# Patient Record
Sex: Female | Born: 1960 | Race: White | Hispanic: No | Smoking: Current every day smoker
Health system: Southern US, Community
[De-identification: ages and names within clinical notes are randomized; demographics above are authoritative.]

## PROBLEM LIST (undated history)

## (undated) DIAGNOSIS — G8929 Other chronic pain: Secondary | ICD-10-CM

## (undated) DIAGNOSIS — M5136 Other intervertebral disc degeneration, lumbar region: Secondary | ICD-10-CM

## (undated) DIAGNOSIS — M51369 Other intervertebral disc degeneration, lumbar region without mention of lumbar back pain or lower extremity pain: Secondary | ICD-10-CM

## (undated) DIAGNOSIS — E119 Type 2 diabetes mellitus without complications: Secondary | ICD-10-CM

## (undated) DIAGNOSIS — M1990 Unspecified osteoarthritis, unspecified site: Secondary | ICD-10-CM

## (undated) DIAGNOSIS — F419 Anxiety disorder, unspecified: Secondary | ICD-10-CM

## (undated) DIAGNOSIS — I10 Essential (primary) hypertension: Secondary | ICD-10-CM

## (undated) DIAGNOSIS — G629 Polyneuropathy, unspecified: Secondary | ICD-10-CM

## (undated) DIAGNOSIS — M5126 Other intervertebral disc displacement, lumbar region: Secondary | ICD-10-CM

## (undated) DIAGNOSIS — Z95828 Presence of other vascular implants and grafts: Secondary | ICD-10-CM

## (undated) HISTORY — DX: Anxiety disorder, unspecified: F41.9

## (undated) HISTORY — DX: Essential (primary) hypertension: I10

---
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
## 2001-06-10 ENCOUNTER — Emergency Department (HOSPITAL_COMMUNITY): Admission: EM | Admit: 2001-06-10 | Discharge: 2001-06-11 | Payer: Self-pay | Admitting: *Deleted

## 2007-12-29 ENCOUNTER — Ambulatory Visit (HOSPITAL_COMMUNITY): Admission: RE | Admit: 2007-12-29 | Discharge: 2007-12-29 | Payer: Self-pay | Admitting: Pulmonary Disease

## 2011-06-15 ENCOUNTER — Emergency Department: Payer: Self-pay | Admitting: Emergency Medicine

## 2013-02-07 ENCOUNTER — Other Ambulatory Visit (HOSPITAL_COMMUNITY): Payer: Self-pay | Admitting: Nurse Practitioner

## 2013-02-07 DIAGNOSIS — Z139 Encounter for screening, unspecified: Secondary | ICD-10-CM

## 2013-02-14 ENCOUNTER — Ambulatory Visit (HOSPITAL_COMMUNITY)
Admission: RE | Admit: 2013-02-14 | Discharge: 2013-02-14 | Disposition: A | Discharge status: Home / Self Care | Payer: Self-pay | Source: Ambulatory Visit | Attending: Nurse Practitioner | Admitting: Nurse Practitioner

## 2013-02-14 DIAGNOSIS — Z139 Encounter for screening, unspecified: Secondary | ICD-10-CM

## 2013-11-24 ENCOUNTER — Emergency Department: Payer: Self-pay | Admitting: Internal Medicine

## 2015-07-25 ENCOUNTER — Ambulatory Visit: Payer: Self-pay

## 2015-08-01 ENCOUNTER — Other Ambulatory Visit: Payer: Self-pay | Admitting: Oncology

## 2015-08-01 ENCOUNTER — Ambulatory Visit: Payer: Self-pay | Attending: Oncology

## 2015-08-01 ENCOUNTER — Ambulatory Visit
Admission: RE | Admit: 2015-08-01 | Discharge: 2015-08-01 | Disposition: A | Discharge status: Home / Self Care | Payer: Self-pay | Source: Ambulatory Visit | Attending: Oncology | Admitting: Oncology

## 2015-08-01 VITALS — BP 117/80 | HR 102 | Temp 97.7°F | Ht 63.0 in | Wt 174.6 lb

## 2015-08-01 DIAGNOSIS — Z0000 Encounter for general adult medical examination without abnormal findings: Secondary | ICD-10-CM

## 2015-08-01 NOTE — Progress Notes (Signed)
Subjective:     Patient ID: Tammy Rubio, female   DOB: 1961/06/10, 54 y.o.   MRN: [id number]  HPI   Review of Systems     Objective:   Physical Exam  Pulmonary/Chest: Right breast exhibits no inverted nipple, no mass, no nipple discharge, no skin change and no tenderness. Left breast exhibits no inverted nipple, no mass, no nipple discharge, no skin change and no tenderness. Breasts are asymmetrical.  Left breast 1/2 cup size smaller than right       Assessment:    54 year old patient presents for [facility].  Patient screened, and meets BCCCP eligibility.  Patient does not have insurance, Medicare or Medicaid.  Handout given on Affordable Care Act. CBE unremarkable.  Instructed patient on breast self-exam using teach back method.  Patient uses [employer] for transportation.     Plan:     Sent for bilateral screening mammogram.

## 2015-08-04 LAB — PAP LB AND HPV HIGH-RISK
HPV, HIGH-RISK: NEGATIVE
PAP SMEAR COMMENT: 0

## 2015-08-21 NOTE — Progress Notes (Signed)
Phoned patient to give her normal mammogram and pap results. Reminded her to return in one year for annual mammogram.  Her next pap is due in 5 years.

## 2016-08-06 ENCOUNTER — Ambulatory Visit: Payer: Self-pay | Attending: Oncology

## 2016-08-06 ENCOUNTER — Ambulatory Visit
Admission: RE | Admit: 2016-08-06 | Discharge: 2016-08-06 | Disposition: A | Discharge status: Home / Self Care | Payer: Self-pay | Source: Ambulatory Visit | Attending: Oncology | Admitting: Oncology

## 2016-08-06 ENCOUNTER — Encounter (INDEPENDENT_AMBULATORY_CARE_PROVIDER_SITE_OTHER): Payer: Self-pay

## 2016-08-06 VITALS — BP 131/83 | HR 108 | Temp 98.2°F | Ht 63.39 in | Wt 165.9 lb

## 2016-08-06 DIAGNOSIS — Z0000 Encounter for general adult medical examination without abnormal findings: Secondary | ICD-10-CM

## 2016-08-06 NOTE — Progress Notes (Signed)
Subjective:     Patient ID: Tammy Rubio, female   DOB: October 27, 1960, 55 y.o.   MRN: [id number]  HPI   Review of Systems     Objective:   Physical Exam  Pulmonary/Chest: Right breast exhibits no inverted nipple, no mass, no nipple discharge, no skin change and no tenderness. Left breast exhibits no inverted nipple, no mass, no nipple discharge, no skin change and no tenderness. Breasts are symmetrical.  Tattoo left chest       Assessment:     55 year old patient presents for [facility] clinic visit.  Patient screened, and meets BCCCP eligibility.  Patient does not have insurance, Medicare or Medicaid.  Handout given on Affordable Care Act.  Instructed patient on breast self-exam using teach back method.  CBE unremarkable.  No mass or lump palpated. Patient had normal pap in 2015 with negative HPV.  Refuses pelvic exam.    Plan:     Sent for bilateral screening mammogram.

## 2016-08-13 NOTE — Progress Notes (Signed)
Letter mailed from [facility] to notify of normal mammogram results.  Patient to return in one year for annual screening.  Copy to [facility]. 

## 2019-05-31 ENCOUNTER — Other Ambulatory Visit (HOSPITAL_COMMUNITY): Payer: Self-pay | Admitting: Family Medicine

## 2019-05-31 DIAGNOSIS — Z1231 Encounter for screening mammogram for malignant neoplasm of breast: Secondary | ICD-10-CM

## 2019-06-06 ENCOUNTER — Ambulatory Visit (HOSPITAL_COMMUNITY)
Admission: RE | Admit: 2019-06-06 | Discharge: 2019-06-06 | Disposition: A | Discharge status: Home / Self Care | Payer: Self-pay | Source: Ambulatory Visit | Attending: Family Medicine | Admitting: Family Medicine

## 2019-06-06 ENCOUNTER — Other Ambulatory Visit: Payer: Self-pay

## 2019-06-06 DIAGNOSIS — Z1231 Encounter for screening mammogram for malignant neoplasm of breast: Secondary | ICD-10-CM | POA: Insufficient documentation

## 2019-06-14 ENCOUNTER — Other Ambulatory Visit (HOSPITAL_COMMUNITY): Payer: Self-pay | Admitting: Family Medicine

## 2019-06-14 DIAGNOSIS — R928 Other abnormal and inconclusive findings on diagnostic imaging of breast: Secondary | ICD-10-CM

## 2019-06-17 ENCOUNTER — Ambulatory Visit (HOSPITAL_COMMUNITY): Payer: Medicaid Other

## 2019-06-17 ENCOUNTER — Ambulatory Visit (HOSPITAL_COMMUNITY)
Admission: RE | Admit: 2019-06-17 | Discharge: 2019-06-17 | Disposition: A | Discharge status: Home / Self Care | Payer: Medicaid Other | Source: Ambulatory Visit | Attending: Family Medicine | Admitting: Family Medicine

## 2019-06-17 ENCOUNTER — Other Ambulatory Visit: Payer: Self-pay

## 2019-06-17 DIAGNOSIS — R928 Other abnormal and inconclusive findings on diagnostic imaging of breast: Secondary | ICD-10-CM

## 2019-06-22 ENCOUNTER — Other Ambulatory Visit (HOSPITAL_COMMUNITY): Payer: Self-pay | Admitting: *Deleted

## 2019-06-22 DIAGNOSIS — R928 Other abnormal and inconclusive findings on diagnostic imaging of breast: Secondary | ICD-10-CM

## 2019-06-22 NOTE — Progress Notes (Signed)
Phoned patient, and primary provider on 9/15 to provide BCCCP services  to patient prior to Breast biopsy.  Patient states she enrolled in [place] in [facility] yesterday.

## 2019-06-28 ENCOUNTER — Ambulatory Visit (HOSPITAL_COMMUNITY)
Admission: RE | Admit: 2019-06-28 | Discharge: 2019-06-28 | Disposition: A | Discharge status: Home / Self Care | Payer: Medicaid Other | Source: Ambulatory Visit | Attending: Family Medicine | Admitting: Family Medicine

## 2019-06-28 ENCOUNTER — Other Ambulatory Visit (HOSPITAL_COMMUNITY): Payer: Self-pay | Admitting: Family Medicine

## 2019-06-28 ENCOUNTER — Other Ambulatory Visit: Payer: Self-pay

## 2019-06-28 ENCOUNTER — Ambulatory Visit (HOSPITAL_COMMUNITY)
Admission: RE | Admit: 2019-06-28 | Discharge: 2019-06-28 | Disposition: A | Discharge status: Home / Self Care | Payer: Medicaid Other | Source: Ambulatory Visit | Attending: *Deleted | Admitting: *Deleted

## 2019-06-28 DIAGNOSIS — R928 Other abnormal and inconclusive findings on diagnostic imaging of breast: Secondary | ICD-10-CM | POA: Insufficient documentation

## 2019-06-28 MED ORDER — SODIUM BICARBONATE 4.2 % IV SOLN
INTRAVENOUS | Status: AC
  Filled 2019-06-28: qty 10

## 2019-06-28 MED ORDER — LIDOCAINE HCL (PF) 2 % IJ SOLN
INTRAMUSCULAR | Status: AC
  Filled 2019-06-28: qty 10

## 2019-06-28 MED ORDER — LIDOCAINE-EPINEPHRINE (PF) 1 %-1:200000 IJ SOLN
INTRAMUSCULAR | Status: AC
  Filled 2019-06-28: qty 30

## 2019-06-30 ENCOUNTER — Other Ambulatory Visit: Payer: Self-pay

## 2019-07-01 LAB — SURGICAL PATHOLOGY

## 2019-07-05 ENCOUNTER — Ambulatory Visit (INDEPENDENT_AMBULATORY_CARE_PROVIDER_SITE_OTHER): Payer: Self-pay | Admitting: General Surgery

## 2019-07-05 ENCOUNTER — Other Ambulatory Visit: Payer: Self-pay

## 2019-07-05 ENCOUNTER — Encounter: Payer: Self-pay | Admitting: General Surgery

## 2019-07-05 VITALS — BP 104/72 | HR 102 | Temp 97.8°F | Resp 16 | Ht 63.0 in | Wt 157.0 lb

## 2019-07-05 DIAGNOSIS — C50912 Malignant neoplasm of unspecified site of left female breast: Secondary | ICD-10-CM

## 2019-07-05 NOTE — Progress Notes (Signed)
[facility] History and Physical  Reason for Referral: Left Breast Cancer  Referring Physician:  Dr. Shelly Bombard   Chief Complaint    Breast Cancer      Tammy Rubio is a 58 y.o. female.  HPI:  Tammy Rubio is a 58 yo with a newly diagnosed left breast cancer with biopsy positive lymph node after US guided biopsy.  She reports noticing a large mass in her left breast in August a few weeks before her mammogram. She had her last mammogram in 2017, and this was normal at the time. She had not noticed anything prior to August per her report.   The patient has no history of any masses, lumps, bumps, nipple changes or discharge.She had menarche at age 85, and her first pregnancy at age 48. She is G4P2. She underwent menopause at age 59. She has never had any previous biopsies or concerning areas on mammogram.  She has not had any chest radiation. She has a family history of breast cancer in her maternal grandmother and maternal aunt.  She states that her nipple has started to deviate laterally.  She is very anxious about her new findings and went to see Dr. Cindie Laroche today regarding anxiety medication.    Past Medical History:  Diagnosis Date  . Anxiety   . Hypertension     Past Surgical History:  Procedure Laterality Date  . DILATION AND CURETTAGE, DIAGNOSTIC / THERAPEUTIC    . LAPAROSCOPIC TUBAL LIGATION    . TONSILLECTOMY AND ADENOIDECTOMY      Family History  Problem Relation Age of Onset  . Breast cancer Maternal Aunt   . Breast cancer Maternal Grandmother   . Colon cancer Paternal Uncle     Social History   Tobacco Use  . Smoking status: Current Every Day Smoker    Packs/day: 0.50    Years: 15.00    Pack years: 7.50    Types: Cigarettes  . Smokeless tobacco: Never Used  Substance Use Topics  . Alcohol use: Not Currently    Frequency: Never  . Drug use: Never    Medications: I have reviewed the patient's current medications. Allergies as of 07/05/2019    No Known Allergies     Medication List       Accurate as of July 05, 2019 11:59 PM. If you have any questions, ask your nurse or doctor.        amLODipine 10 MG tablet Commonly known as: NORVASC 10 mg.   gabapentin 300 MG capsule Commonly known as: NEURONTIN 300 mg.   ibuprofen 600 MG tablet Commonly known as: ADVIL 600 mg.   metFORMIN 500 MG (MOD) 24 hr tablet Commonly known as: GLUMETZA 500 mg.        ROS:  A comprehensive review of systems was negative except for: Ears, nose, mouth, throat, and face: positive for sinus problems Respiratory: positive for cough Gastrointestinal: positive for abdominal pain and reflux symptoms Neurological: positive for numbness Endocrine: positive for diabetic symptoms including polydipsia and temperature intolerance  Blood pressure 104/72, pulse (!) 102, temperature 97.8 F (36.6 C), temperature source Tympanic, resp. rate 16, height 5\' 3"  (1.6 m), weight 157 lb (71.2 kg), last menstrual period 08/06/2013, SpO2 95 %. Physical Exam Vitals signs reviewed.  Constitutional:      Appearance: Normal appearance.  HENT:     Head: Normocephalic and atraumatic.     Nose: Nose normal.  Eyes:     Extraocular Movements: Extraocular movements intact.  Pupils: Pupils are equal, round, and reactive to light.  Neck:     Musculoskeletal: Normal range of motion. No neck rigidity.  Cardiovascular:     Rate and Rhythm: Normal rate and regular rhythm.  Pulmonary:     Effort: Pulmonary effort is normal.     Breath sounds: Normal breath sounds.  Chest:     Breasts:        Right: Normal. No inverted nipple, mass or tenderness.        Left: Mass and tenderness present.     Comments: Left nipple slightly deviated to the left/ starting to invert; left breast mass > 6cm on upper outer breast Abdominal:     General: There is no distension.     Palpations: Abdomen is soft.     Tenderness: There is no abdominal tenderness.   Musculoskeletal: Normal range of motion.        General: No swelling.  Lymphadenopathy:     Upper Body:     Right upper body: No supraclavicular or axillary adenopathy.     Left upper body: Axillary adenopathy present. No supraclavicular adenopathy.  Skin:    General: Skin is warm and dry.  Neurological:     General: No focal deficit present.     Mental Status: She is alert and oriented to person, place, and time.  Psychiatric:        Mood and Affect: Mood normal.        Behavior: Behavior normal.        Thought Content: Thought content normal.        Judgment: Judgment normal.     Results: 06/06/2019 Mammogram Screening CLINICAL DATA:  Screening.  EXAM: DIGITAL SCREENING BILATERAL MAMMOGRAM WITH TOMO AND CAD  COMPARISON:  Previous exam(s).  ACR Breast Density Category b: There are scattered areas of fibroglandular density.  FINDINGS: In the right breast there possible masses which require further evaluation.  In the left breast there is a distortion with associated microcalcifications, and a separate mass. Left breast skin thickening and left axillary lymphadenopathy is also noted. These findings require further evaluation.  Images were processed with CAD.  IMPRESSION: Further evaluation is suggested for possible masses in the right breast.  Further evaluation is suggested for possible distortion/calcifications, mass in the left breast, and left axillary lymphadenopathy.  RECOMMENDATION: Diagnostic mammogram and possibly ultrasound of both breasts. (Code:FI-B-93M)  The patient will be contacted regarding the findings, and additional imaging will be scheduled.  BI-RADS CATEGORY  0: Incomplete. Need additional imaging evaluation and/or prior mammograms for comparison.   Electronically Signed   By: Fidela Salisbury M.D.   On: 06/07/2019 14:18  06/17/19 Mammogram Diagnostic CLINICAL DATA:  58 year old patient recalled from recent  screening mammogram for evaluation of left breast distortion with associated calcifications and a mass. Left breast skin thickening and left axillary adenopathy were noted.  She was also recalled for evaluation of possible right breast masses.  In discussion with the patient today, she says that she has noticed a palpable lump in the outer left breast, but did not mention it at the time of her screening mammogram.  EXAM: DIGITAL DIAGNOSTIC BILATERAL MAMMOGRAM WITH CAD AND TOMO  ULTRASOUND BILATERAL BREAST  COMPARISON:  Previous exam(s).  ACR Breast Density Category c: The breast tissue is heterogeneously dense, which may obscure small masses.  FINDINGS: An irregular mass with innumerable pleomorphic calcifications in the middle and anterior thirds of the left breast spans approximately 6.2 x 3.8 x 1.6 cm.  There is skin thickening of the left breast inferiorly. A 0.6 cm rounded mass is seen just superior and posterior to the dominant irregular mass.  Spot compression view of the retroareolar right breast with tomography show a few small circumscribed masses and dense tissue.  Mammographic images were processed with CAD.  On physical exam, there is a firm lump centered in the 2 o'clock to 2:30 axis of the left breast extending from retroareolar to approximately 6 cm distal to the nipple. I do not palpate a lump in the left axilla.  Targeted ultrasound is performed, showing markedly hypoechoic irregular mass containing internal calcifications in the 2 o'clock to 2:30 position of the left breast in the region of the palpable lump. On ultrasound, mass margins are indistinct, but the mass measures at least 6 cm greatest diameter. Just superior and lateral to the mass at approximately 2 o'clock position 6-7 cm from the nipple is an intramammary lymph node that measures 5 x 5 x 4 mm, and has echogenic areas within its hilum that may reflect calcifications.   Ultrasound of the left axilla shows 7 (seven) axillary lymph nodes with variable but suspicious features. The largest lymph node is 2.1 x 1.2 x 1.1 cm, has a thickened cortex measuring 4.4 mm, and contains internal calcifications. This correlates with the lymph node seen on the screening mammogram. The second most suspicious appearing lymph node is completely hypoechoic, oval, and measures 1.2 x 1.5 x 0.9 cm.  Ultrasound of the right breast shows multiple benign appearing simple and complicated cysts. The right axilla is negative for lymphadenopathy.  IMPRESSION: 1. Clinical and imaging findings suggestive of locally advanced breast cancer on the left. 2. Skin thickening of the left breast. 3. 7 suspicious left axillary lymph nodes identified. 4. No evidence of malignancy in the right breast.  Benign cysts.  RECOMMENDATION: A total of 3 ultrasound-guided biopsies are recommended. Biopsies of the dominant palpable left breast mass, the adjacent suspicious intramammary lymph node at 2 o'clock position, and of one of the suspicious left axillary lymph nodes. These biopsies will be scheduled for the patient.  I have discussed the findings and recommendations with the patient. If applicable, a reminder letter will be sent to the patient regarding the next appointment.  BI-RADS CATEGORY  5: Highly suggestive of malignancy.   Electronically Signed   By: Curlene Dolphin M.D.   On: 06/17/2019 17:01  06/17/19 [place] Left breast CLINICAL DATA:  58 year old patient recalled from recent screening mammogram for evaluation of left breast distortion with associated calcifications and a mass. Left breast skin thickening and left axillary adenopathy were noted.  She was also recalled for evaluation of possible right breast masses.  In discussion with the patient today, she says that she has noticed a palpable lump in the outer left breast, but did not mention it at the time of her  screening mammogram.  EXAM: DIGITAL DIAGNOSTIC BILATERAL MAMMOGRAM WITH CAD AND TOMO  ULTRASOUND BILATERAL BREAST  COMPARISON:  Previous exam(s).  ACR Breast Density Category c: The breast tissue is heterogeneously dense, which may obscure small masses.  FINDINGS: An irregular mass with innumerable pleomorphic calcifications in the middle and anterior thirds of the left breast spans approximately 6.2 x 3.8 x 1.6 cm. There is skin thickening of the left breast inferiorly. A 0.6 cm rounded mass is seen just superior and posterior to the dominant irregular mass.  Spot compression view of the retroareolar right breast with tomography show a few small circumscribed  masses and dense tissue.  Mammographic images were processed with CAD.  On physical exam, there is a firm lump centered in the 2 o'clock to 2:30 axis of the left breast extending from retroareolar to approximately 6 cm distal to the nipple. I do not palpate a lump in the left axilla.  Targeted ultrasound is performed, showing markedly hypoechoic irregular mass containing internal calcifications in the 2 o'clock to 2:30 position of the left breast in the region of the palpable lump. On ultrasound, mass margins are indistinct, but the mass measures at least 6 cm greatest diameter. Just superior and lateral to the mass at approximately 2 o'clock position 6-7 cm from the nipple is an intramammary lymph node that measures 5 x 5 x 4 mm, and has echogenic areas within its hilum that may reflect calcifications.  Ultrasound of the left axilla shows 7 (seven) axillary lymph nodes with variable but suspicious features. The largest lymph node is 2.1 x 1.2 x 1.1 cm, has a thickened cortex measuring 4.4 mm, and contains internal calcifications. This correlates with the lymph node seen on the screening mammogram. The second most suspicious appearing lymph node is completely hypoechoic, oval, and measures 1.2 x 1.5 x 0.9  cm.  Ultrasound of the right breast shows multiple benign appearing simple and complicated cysts. The right axilla is negative for lymphadenopathy.  IMPRESSION: 1. Clinical and imaging findings suggestive of locally advanced breast cancer on the left. 2. Skin thickening of the left breast. 3. 7 suspicious left axillary lymph nodes identified. 4. No evidence of malignancy in the right breast.  Benign cysts.  RECOMMENDATION: A total of 3 ultrasound-guided biopsies are recommended. Biopsies of the dominant palpable left breast mass, the adjacent suspicious intramammary lymph node at 2 o'clock position, and of one of the suspicious left axillary lymph nodes. These biopsies will be scheduled for the patient.  I have discussed the findings and recommendations with the patient. If applicable, a reminder letter will be sent to the patient regarding the next appointment.  BI-RADS CATEGORY  5: Highly suggestive of malignancy.   Electronically Signed   By: Curlene Dolphin M.D.   On: 06/17/2019 17:01  06/28/19 Biopsy CLINICAL DATA:  Post ultrasound-guided biopsy of a mass with associated calcifications in the left breast the 1 o'clock position, ultrasound-guided biopsy of an intramammary lymph node in the left breast at the 2 o'clock position and ultrasound-guided biopsy of a morphologically abnormal lymph node in the left axilla.  EXAM: DIAGNOSTIC LEFT MAMMOGRAM POST ULTRASOUND BIOPSY  COMPARISON:  Previous exam(s).  FINDINGS: Mammographic images were obtained following ultrasound guided biopsy of a mass in the left breast at the 1 o'clock position, ultrasound-guided biopsy an intramammary lymph node in the left breast at the 2 o'clock position and ultrasound-guided biopsy of a morphologically abnormal lymph node in the left axilla. A ribbon shaped biopsy marking clip is present at the site of the biopsied mass with associated calcifications in the left breast the 1  o'clock position. A wing shaped biopsy marking clip is present the site of the biopsied intramammary lymph node in the left breast at the 2 o'clock position. A spiral shaped HydroMARK clip is present at the site of the biopsied lymph node in the left axilla.  IMPRESSION: 1. Ribbon shaped biopsy marking clip at site of biopsied mass with associated calcifications in the left breast at the 1 o'clock position.  2. Wing shaped biopsy marking clip at site of biopsied intramammary lymph node in the left  breast the 2 o'clock position.  3. Spiral shaped HydroMARK clip at site of biopsied lymph node in the left axilla.  Final Assessment: Post Procedure Mammograms for Marker Placement   Electronically Signed   By: Everlean Alstrom M.D.   On: 06/28/2019 13:56  ADDENDUM REPORT: 06/30/2019 14:57  ADDENDUM: PATHOLOGY revealed:  A. BREAST, LEFT, 1:00, BIOPSY: - Invasive ductal carcinoma. - Ductal carcinoma in situ. - See comment.  B. BREAST, LEFT, 2:00, INTRAMAMMARY LYMPH NODE, BIOPSY: - Benign lymph nodal tissue. - There is no evidence of malignancy.  C. LYMPH NODES, LEFT AXILLA, BIOPSY: - Invasive ductal carcinoma. -  COMMENT: A. The carcinoma appears grade 3. A breast prognostic profile will be performed and the results reported separately. COMMENT: C. No lymph nodal tissue is identified.  Pathology results are CONCORDANT with imaging findings, per Dr. Everlean Alstrom.  Pathology results were discussed with patient via telephone. The patient reported doing well after the biopsy with tenderness at the site. Post biopsy care instructions were reviewed and questions were answered. The patient was encouraged to call [facility] for any additional concerns.  Recommendation: Surgical referral. Request for surgical referral was relayed to Kathi Der RT at [facility] Mammography Department by Electa Sniff RN on 06/30/2019.  Addendum by  Electa Sniff RN on 06/30/2019.   Electronically Signed   By: Everlean Alstrom M.D.   On: 06/30/2019 14:57  Assessment & Plan:  FELESHA ROBBS is a 58 y.o. female with a locally advanced left breast cancer. Clinically the mass is at least 6 cm and is 6 cm on [place].  One of the lymph nodes was biopsied positive for invasive ductal carcinoma.    -Referral to Dr. Larna Daughters for neoadjuvant therapy given the locally advanced nature of the cancer  Future Appointments  Date Time Provider [place]  07/12/2019  9:15 AM Derek Jack, MD [facility] None   We discussed that her cancer was more advanced and that she would benefit from chemoradiation treatment prior to surgery to reduce the size of the cancer. We discussed port a catheter placement and the risk of bleeding, infection, malfunction, injury to vessels, and pneumothorax.    Once she has talked with Dr. Larna Daughters, we will proceed with her port placement.   All questions were answered to the satisfaction of the patient and family.  I spent over 45 minutes with the patient discussing the breast cancer, the locally advanced nature, and her options, and discussing the plan for a port once she has seen Dr. Delton Coombes.   Virl Cagey 07/08/2019, 11:35 PM

## 2019-07-05 NOTE — Patient Instructions (Signed)
Breast Cancer, Female  Breast cancer is a malignant growth of tissue (tumor) in the breast. Unlike noncancerous (benign) tumors, malignant tumors are cancerous and can spread to other parts of the body. The two most common types of breast cancer start in the milk ducts (ductal carcinoma) or in the lobules where milk is made in the breast (lobular carcinoma). Breast cancer is one of the most common types of cancer in women. What are the causes? The exact cause of female breast cancer is unknown. What increases the risk? The following factors may make you more likely to develop this condition:  Being older than 56 years of age.  Race and ethnicity. Caucasian women generally have an increased risk, but African-American women are more likely to develop the disease before age 18.  Having a family history of breast cancer.  Having had breast cancer in the past.  Having certain noncancerous conditions of the breast, such as dense breast tissue.  Having the BRCA1 and BRCA2 genes.  Having a history of radiation exposure.  Obesity.  Starting menopause after age 66.  Starting your menstrual periods before age 10.  Having never been pregnant or having your first child after age 55.  Having never breastfed.  Using hormone therapy after menopause.  Using birth control pills.  Drinking more than one alcoholic drink a day.  Exposure to the drug DES, which was given to pregnant women from the 1940s to the 1970s. What are the signs or symptoms? Symptoms of this condition include:  A painless lump or thickening in your breast.  Changes in the size or shape of your breast.  Breast skin changes, such as puckering or dimpling.  Nipple abnormalities, such as scaling, crustiness, redness, or pulling in (retraction).  Nipple discharge that is bloody or clear. How is this diagnosed? This condition may be diagnosed by:  Taking your medical history and doing a physical exam. During the  exam, your health care provider will feel the tissue around your breast and under your arms.  Taking a sample of nipple discharge. The sample will be examined under a microscope.  Performing imaging tests, such as breast X-rays (mammogram), breast ultrasound exams, or an MRI.  Taking a tissue sample (biopsy) from the breast. The sample will be examined under a microscope to look for cancer cells.  Taking a sample from the lymph nodes near the affected breast (sentinel node biopsy). Your cancer will be staged to determine its severity and extent. Staging is a careful attempt to find out the size of the tumor, whether the cancer has spread, and if so, to what parts of the body. Staging also includes testing your tumor for certain receptors, such as estrogen, progesterone, and human epidermal growth factor receptor 2 (HER2). This will help your cancer care team decide on a treatment that will work best for you. You may need to have more tests to determine the stage of your cancer. Stages include the following:  Stage 0-The tumor has not spread to other breast tissue.  Stage I-The cancer is only found in the breast or may be in the lymph nodes. The tumor may be up to  in (2 cm) wide.  Stage II-The cancer has spread to nearby lymph nodes. The tumor may be up to 2 in (5 cm) wide.  Stage III-The cancer has spread to more distant lymph nodes. The tumor may be larger than 2 in (5 cm) wide.  Stage IV-The cancer has spread to other parts of  the body, such as the bones, brain, liver, or lungs. How is this treated? Treatment for this condition depends on the type and stage of the breast cancer. It may be treated with:  Surgery. This may involve breast-conserving surgery (lumpectomy or partial mastectomy) in which only the part of the breast containing the cancer is removed. Some normal tissue surrounding this area may also be removed. In some cases, surgery may be done to remove the entire breast  (mastectomy) and nipple. Lymph nodes may also be removed.  Radiation therapy, which uses high-energy rays to kill cancer cells.  Chemotherapy, which is the use of drugs to kill cancer cells.  Hormone therapy, which involves taking medicine to adjust the hormone levels in your body. You may take medicine to decrease your estrogen levels. This can help stop cancer cells from growing.  Targeted therapy, in which drugs are used to block the growth and spread of cancer cells. These drugs target a specific part of the cancer cell and usually cause fewer side effects than chemotherapy. Targeted therapy may be used alone or in combination with chemotherapy.  A combination of surgery, radiation, chemotherapy, or hormone therapy may be needed to treat breast cancer. Follow these instructions at home:  Take over-the-counter and prescription medicines only as told by your health care provider.  Eat a healthy diet. A healthy diet includes lots of fruits and vegetables, low-fat dairy products, lean meats, and fiber. ? Make sure half your plate is filled with fruits or vegetables. ? Choose high-fiber foods such as whole-grain breads and cereals.  Consider joining a support group. This may help you learn to cope with the stress of having breast cancer.  Talk to your health care team about exercise and physical activity. The right exercise program can: ? Help prevent or reduce symptoms such as fatigue or depression. ? Improve overall health and survival rates.  Keep all follow-up visits as told by your health care provider. This is important. Where to find more information  American Cancer Society: www.cancer.[place]: www.cancer.gov Contact a health care provider if:  You have a sudden increase in pain.  You have any symptoms or changes that concern you.  You lose weight without trying.  You notice a new lump in either breast or under your arm.  You develop swelling in  either arm or hand.  You have a fever.  You notice new fatigue or weakness. Get help right away if:  You have chest pain or trouble breathing.  You faint. Summary  Breast cancer is a malignant growth of tissue (tumor) in the breast.  Your cancer will be staged to determine its severity and extent.  Treatment for this condition depends on the type and stage of the breast cancer. This information is not intended to replace advice given to you by your health care provider. Make sure you discuss any questions you have with your health care provider. Document Released: 12/31/2005 Document Revised: 09/04/2017 Document Reviewed: 05/18/2017 Elsevier Patient Education  [place] An implanted port is a device that is placed under the skin. It is usually placed in the chest. The device can be used to give IV medicine, to take blood, or for dialysis. You may have an implanted port if:  You need IV medicine that would be irritating to the small veins in your hands or arms.  You need IV medicines, such as antibiotics, for a long period of time.  You need IV nutrition for a long period of time.  You need dialysis. Having a port means that your health care provider will not need to use the veins in your arms for these procedures. You may have fewer limitations when using a port than you would if you used other types of long-term IVs, and you will likely be able to return to normal activities after your incision heals. An implanted port has two main parts:  Reservoir. The reservoir is the part where a needle is inserted to give medicines or draw blood. The reservoir is round. After it is placed, it appears as a small, raised area under your skin.  Catheter. The catheter is a thin, flexible tube that connects the reservoir to a vein. Medicine that is inserted into the reservoir goes into the catheter and then into the vein. How is my port accessed? To access  your port:  A numbing cream may be placed on the skin over the port site.  Your health care provider will put on a mask and sterile gloves.  The skin over your port will be cleaned carefully with a germ-killing soap and allowed to dry.  Your health care provider will gently pinch the port and insert a needle into it.  Your health care provider will check for a blood return to make sure the port is in the vein and is not clogged.  If your port needs to remain accessed to get medicine continuously (constant infusion), your health care provider will place a clear bandage (dressing) over the needle site. The dressing and needle will need to be changed every week, or as told by your health care provider. What is flushing? Flushing helps keep the port from getting clogged. Follow instructions from your health care provider about how and when to flush the port. Ports are usually flushed with saline solution or a medicine called heparin. The need for flushing will depend on how the port is used:  If the port is only used from time to time to give medicines or draw blood, the port may need to be flushed: ? Before and after medicines have been given. ? Before and after blood has been drawn. ? As part of routine maintenance. Flushing may be recommended every 4-6 weeks.  If a constant infusion is running, the port may not need to be flushed.  Throw away any syringes in a disposal container that is meant for sharp items (sharps container). You can buy a sharps container from a pharmacy, or you can make one by using an empty hard plastic bottle with a cover. How long will my port stay implanted? The port can stay in for as long as your health care provider thinks it is needed. When it is time for the port to come out, a surgery will be done to remove it. The surgery will be similar to the procedure that was done to put the port in. Follow these instructions at home:   Flush your port as told by your  health care provider.  If you need an infusion over several days, follow instructions from your health care provider about how to take care of your port site. Make sure you: ? Wash your hands with soap and water before you change your dressing. If soap and water are not available, use alcohol-based hand sanitizer. ? Change your dressing as told by your health care provider. ? Place any used dressings or infusion bags into a plastic bag. Throw that  bag in the trash. ? Keep the dressing that covers the needle clean and dry. Do not get it wet. ? Do not use scissors or sharp objects near the tube. ? Keep the tube clamped, unless it is being used.  Check your port site every day for signs of infection. Check for: ? Redness, swelling, or pain. ? Fluid or blood. ? Pus or a bad smell.  Protect the skin around the port site. ? Avoid wearing bra straps that rub or irritate the site. ? Protect the skin around your port from seat belts. Place a soft pad over your chest if needed.  Bathe or shower as told by your health care provider. The site may get wet as long as you are not actively receiving an infusion.  Return to your normal activities as told by your health care provider. Ask your health care provider what activities are safe for you.  Carry a medical alert card or wear a medical alert bracelet at all times. This will let health care providers know that you have an implanted port in case of an emergency. Get help right away if:  You have redness, swelling, or pain at the port site.  You have fluid or blood coming from your port site.  You have pus or a bad smell coming from the port site.  You have a fever. Summary  Implanted ports are usually placed in the chest for long-term IV access.  Follow instructions from your health care provider about flushing the port and changing bandages (dressings).  Take care of the area around your port by avoiding clothing that puts pressure on the  area, and by watching for signs of infection.  Protect the skin around your port from seat belts. Place a soft pad over your chest if needed.  Get help right away if you have a fever or you have redness, swelling, pain, drainage, or a bad smell at the port site. This information is not intended to replace advice given to you by your health care provider. Make sure you discuss any questions you have with your health care provider. Document Released: 09/22/2005 Document Revised: 01/14/2019 Document Reviewed: 10/25/2016 Elsevier Patient Education  2020 [employer].

## 2019-07-08 ENCOUNTER — Encounter: Payer: Self-pay | Admitting: General Surgery

## 2019-07-08 DIAGNOSIS — C50912 Malignant neoplasm of unspecified site of left female breast: Secondary | ICD-10-CM | POA: Insufficient documentation

## 2019-07-11 ENCOUNTER — Encounter (HOSPITAL_COMMUNITY): Payer: Self-pay

## 2019-07-12 ENCOUNTER — Other Ambulatory Visit: Payer: Self-pay

## 2019-07-12 ENCOUNTER — Encounter (HOSPITAL_COMMUNITY): Payer: Self-pay | Admitting: Hematology

## 2019-07-12 ENCOUNTER — Inpatient Hospital Stay (HOSPITAL_COMMUNITY): Payer: Medicaid Other | Attending: Hematology | Admitting: Hematology

## 2019-07-12 VITALS — BP 122/72 | HR 109 | Temp 97.3°F | Resp 16 | Ht 63.0 in | Wt 160.8 lb

## 2019-07-12 DIAGNOSIS — Z5111 Encounter for antineoplastic chemotherapy: Secondary | ICD-10-CM | POA: Diagnosis present

## 2019-07-12 DIAGNOSIS — Z800 Family history of malignant neoplasm of digestive organs: Secondary | ICD-10-CM | POA: Insufficient documentation

## 2019-07-12 DIAGNOSIS — Z803 Family history of malignant neoplasm of breast: Secondary | ICD-10-CM | POA: Diagnosis not present

## 2019-07-12 DIAGNOSIS — C50912 Malignant neoplasm of unspecified site of left female breast: Secondary | ICD-10-CM | POA: Diagnosis not present

## 2019-07-12 DIAGNOSIS — Z23 Encounter for immunization: Secondary | ICD-10-CM | POA: Insufficient documentation

## 2019-07-12 DIAGNOSIS — Z171 Estrogen receptor negative status [ER-]: Secondary | ICD-10-CM | POA: Diagnosis not present

## 2019-07-12 DIAGNOSIS — F3289 Other specified depressive episodes: Secondary | ICD-10-CM

## 2019-07-12 DIAGNOSIS — F17210 Nicotine dependence, cigarettes, uncomplicated: Secondary | ICD-10-CM | POA: Diagnosis not present

## 2019-07-12 DIAGNOSIS — R634 Abnormal weight loss: Secondary | ICD-10-CM | POA: Insufficient documentation

## 2019-07-12 DIAGNOSIS — Z0000 Encounter for general adult medical examination without abnormal findings: Secondary | ICD-10-CM

## 2019-07-12 DIAGNOSIS — G629 Polyneuropathy, unspecified: Secondary | ICD-10-CM | POA: Diagnosis not present

## 2019-07-12 DIAGNOSIS — Z7689 Persons encountering health services in other specified circumstances: Secondary | ICD-10-CM | POA: Insufficient documentation

## 2019-07-12 MED ORDER — INFLUENZA VAC SPLIT QUAD 0.5 ML IM SUSY
PREFILLED_SYRINGE | INTRAMUSCULAR | Status: AC
  Filled 2019-07-12: qty 0.5

## 2019-07-12 MED ORDER — INFLUENZA VAC SPLIT QUAD 0.5 ML IM SUSY
0.5000 mL | PREFILLED_SYRINGE | Freq: Once | INTRAMUSCULAR | Status: AC
  Administered 2019-07-12: 10:00:00 0.5 mL via INTRAMUSCULAR

## 2019-07-12 NOTE — Patient Instructions (Addendum)
[place] at [facility] Discharge Instructions  You were seen today by Dr. Delton Coombes. He went over your history, family history and how you've been feeling latlely. He will schedule you for a PET scan and an echocardiogram. You will need a port-a-cath placed. He will see you back after your scan for follow up.   Thank you for choosing [place] at [facility] to provide your oncology and hematology care.  To afford each patient quality time with our provider, please arrive at least 15 minutes before your scheduled appointment time.   If you have a lab appointment with the [facility] please come in thru the  Main Entrance and check in at the main information desk  You need to re-schedule your appointment should you arrive 10 or more minutes late.  We strive to give you quality time with our providers, and arriving late affects you and other patients whose appointments are after yours.  Also, if you no show three or more times for appointments you may be dismissed from the clinic at the providers discretion.     Again, thank you for choosing [facility].  Our hope is that these requests will decrease the amount of time that you wait before being seen by our physicians.       _____________________________________________________________  Should you have questions after your visit to [facility], please contact our office at [phone] between the hours of 8:00 a.m. and 4:30 p.m.  Voicemails left after 4:00 p.m. will not be returned until the following business day.  For prescription refill requests, have your pharmacy contact our office and allow 72 hours.    Cancer Center Support Programs:   > Cancer Support Group  2nd Tuesday of the month 1pm-2pm, Journey Room

## 2019-07-12 NOTE — Progress Notes (Signed)
[facility] CONSULT NOTE  Patient Care Team: Lucia Gaskins, MD as PCP - General (Internal Medicine) Rico Junker, RN as Registered Nurse Theodore Demark, RN as Registered Nurse Revelo, Elyse Jarvis, MD (Family Medicine)  CHIEF COMPLAINTS/PURPOSE OF CONSULTATION:  Newly diagnosed left breast cancer  HISTORY OF PRESENTING ILLNESS:  Tammy Rubio 58 y.o. female seen in consultation today for further work-up and management of newly diagnosed left breast cancer.  She reported that she felt a mass in her left breast towards the end of August this year.  Her last mammogram prior to that was in 2017 which was normal.  She denied any previous breast biopsies.  She had mammogram and ultrasound done on 06/17/2019 which showed irregular mass with innumerable calcifications in the middle and anterior thirds of the left breast measuring 6.2 x 3.8 x 1.6 cm.  There is skin thickening of the left breast inferiorly.  0.6 cm rounded mass is seen just superior and posterior to the dominant irregular mass.  Ultrasound of the left axilla showed 7 axillary lymph nodes with variable but suspicious features.  Right breast ultrasound and axillary ultrasound did not reveal any suspicious masses.  She underwent needle biopsy on 06/28/2019.  Left breast 1 o'clock position biopsy showed invasive ductal carcinoma.  Left breast 2:00 intramammary lymph node biopsy showed benign lymph node tissue.  Left axillary lymph node biopsy showed invasive ductal carcinoma.  Receptor status showed negative ER, negative PR.  Ki-67 was 20%.  Tumor cells were positive for HER-2 3+.  She reported 10 pound weight loss since January.  Her medical problems include hypertension, diabetes of 3 to 4 years duration and anxiety.  She reported some numbness in the feet with occasional pain for the last 2 years.  She also had back problems including sciatica and disc problems.  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  She lives by herself at home.  She smokes half pack per day for 40 years.  Family history significant for maternal aunt with breast cancer in her 34s.  Mother had what appears to be glioblastoma.  Paternal uncle had colon cancer and paternal grandmother had throat cancer.  I reviewed her records extensively and collaborated the history with the patient.  SUMMARY OF ONCOLOGIC HISTORY: Oncology History   No history exists.    In terms of breast cancer risk profile:  She menarched at early age of 70 and went to menopause at age 33 She had 2 pregnancy, her first child was born at age 35 She had received birth control pills for approximately 13 years.  She was never exposed to fertility medications or hormone replacement therapy.  She has positive family history of Breast/GYN/GI cancer  MEDICAL HISTORY:  Past Medical History:  Diagnosis Date  . Anxiety   . Hypertension     SURGICAL HISTORY: Past Surgical History:  Procedure Laterality Date  . DILATION AND CURETTAGE, DIAGNOSTIC / THERAPEUTIC    . LAPAROSCOPIC TUBAL LIGATION    . TONSILLECTOMY AND ADENOIDECTOMY      SOCIAL HISTORY: Social History   Socioeconomic History  . Marital status: Divorced    Spouse name: Not on file  . Number of children: 2  . Years of education: Not on file  . Highest education level: Not on file  Occupational History  . Not on file  Social Needs  . Financial resource strain: Somewhat hard  . Food insecurity  Worry: Often true    Inability: Sometimes true  . Transportation needs    Medical: No    Non-medical: No  Tobacco Use  . Smoking status: Current Every Day Smoker    Packs/day: 0.50    Years: 15.00    Pack years: 7.50    Types: Cigarettes  . Smokeless tobacco: Never Used  Substance and Sexual Activity  . Alcohol use: Not Currently    Frequency: Never  . Drug use: Never  . Sexual activity: Yes  Lifestyle  . Physical activity    Days per week: 3  days    Minutes per session: 30 min  . Stress: Only a little  Relationships  . Social Herbalist on phone: Once a week    Gets together: Twice a week    Attends religious service: Never    Active member of club or organization: No    Attends meetings of clubs or organizations: Never    Relationship status: Divorced  . Intimate partner violence    Fear of current or ex partner: No    Emotionally abused: No    Physically abused: No    Forced sexual activity: No  Other Topics Concern  . Not on file  Social History Narrative  . Not on file    FAMILY HISTORY: Family History  Problem Relation Age of Onset  . Breast cancer Maternal Aunt   . Breast cancer Maternal Grandmother   . Colon cancer Paternal Uncle   . Hypertension Mother   . Hypercholesterolemia Mother   . Brain cancer Mother   . Diabetes Father   . Hypertension Father   . Diabetes Sister   . Diabetes Brother   . Hypertension Brother   . Diabetes Brother   . Hypertension Brother   . Healthy Son   . Healthy Son     ALLERGIES:  is allergic to tramadol.  MEDICATIONS:  Current Outpatient Medications  Medication Sig Dispense Refill  . amLODipine (NORVASC) 10 MG tablet Take 10 mg by mouth daily.     . clonazePAM (KLONOPIN) 1 MG tablet Take 1 mg by mouth at bedtime.    . diphenhydrAMINE (BENADRYL) 50 MG tablet Take 50 mg by mouth at bedtime.    . famotidine (PEPCID) 20 MG tablet Take 20 mg by mouth daily.    Marland Kitchen gabapentin (NEURONTIN) 300 MG capsule Take 300 mg by mouth daily.     Marland Kitchen lisinopril-hydrochlorothiazide (ZESTORETIC) 20-12.5 MG tablet Take 1 tablet by mouth daily.    . metFORMIN (GLUMETZA) 500 MG (MOD) 24 hr tablet Take 500 mg by mouth daily.      No current facility-administered medications for this visit.     REVIEW OF SYSTEMS:   Constitutional: Denies fevers, chills or abnormal night sweats Eyes: Denies blurriness of vision, double vision or watery eyes Ears, nose, mouth, throat, and face:  Denies mucositis or sore throat Respiratory: Denies cough, dyspnea or wheezes Cardiovascular: Denies palpitation, chest discomfort or lower extremity swelling Gastrointestinal:  Denies nausea, heartburn or change in bowel habits Skin: Denies abnormal skin rashes Lymphatics: Denies new lymphadenopathy or easy bruising Neurological:Denies numbness, tingling or new weaknesses Behavioral/Psych: Mood is stable, no new changes  Breast: Denies any palpable lumps or discharge All other systems were reviewed with the patient and are negative.  PHYSICAL EXAMINATION: ECOG PERFORMANCE STATUS: 0 - Asymptomatic  Vitals:   07/12/19 0939  BP: 122/72  Pulse: (!) 109  Resp: 16  Temp: (!) 97.3 F (36.3  C)  SpO2: 97%   Filed Weights   07/12/19 0939  Weight: 160 lb 12.8 oz (72.9 kg)    GENERAL:alert, no distress and comfortable SKIN: skin color, texture, turgor are normal, no rashes or significant lesions EYES: normal, conjunctiva are pink and non-injected, sclera clear OROPHARYNX:no exudate, no erythema and lips, buccal mucosa, and tongue normal  NECK: supple, thyroid normal size, non-tender, without nodularity LYMPH:  no palpable lymphadenopathy in the cervical, axillary or inguinal LUNGS: clear to auscultation and percussion with normal breathing effort HEART: regular rate & rhythm and no murmurs and no lower extremity edema ABDOMEN:abdomen soft, non-tender and normal bowel sounds Musculoskeletal:no cyanosis of digits and no clubbing  PSYCH: alert & oriented x 3 with fluent speech NEURO: no focal motor/sensory deficits BREAST: Freely mobile mass in the left breast mainly in the outer quadrant, concentrating in the upper outer quadrant region.  Left breast nipple is slightly outward looking.  No palpable lymphadenopathy in the left axilla.  No palpable mass in the right breast or axilla.  LABORATORY DATA:  I have reviewed the data as listed No results found for: WBC, HGB, HCT, MCV, PLT No  results found for: NA, K, CL, CO2  RADIOGRAPHIC STUDIES: I have personally reviewed the radiological reports and agreed with the findings in the report.  ASSESSMENT AND PLAN:  Locally advanced carcinoma of breast, left (HCC) 1.  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 - Mammogram and ultrasound on 06/17/2019 showed irregular mass with calcifications in the middle and anterior thirds of the left breast measuring approximately 6.2 x 3.8 x 1.6 cm.  There is skin thickening of the left breast inferiorly.  0.6 cm rounded mass just superior and posterior to the dominant irregular mass consistent with intramammary lymph node. -Ultrasound of the left axilla showed 7 axillary lymph nodes, largest lymph node measuring 2.1 x 1.2 x 1.1 cm. - Core biopsy of the left breast at 1 o'clock position consistent with IDC.  Left breast 2:00 intramammary lymph node biopsy was benign.  Left axillary lymph node biopsy consistent with invasive ductal carcinoma. - ER/PR was negative.  Ki-67 was 20%.  HER-2 was 3+ positive. -Physical examination did not show any evidence of inflammatory breast cancer.  Lymphadenopathy in the left axilla was clinically not palpable.  Freely mobile left breast mass mainly in the outer quadrants, mostly in the upper outer quadrant present.  No clinical skin thickening was present.  No palpable mass in the right breast. - She reported 10 pound weight loss since January. -I have recommended a whole-body PET CT scan for staging purposes.  We will also obtain a baseline 2D echocardiogram. -She will require neoadjuvant chemotherapy given tumor size, ER/PR negativity and HER-2 positivity. - We will also request a port placement.  We will consider doing an MRI of the breast. - I will see her back after the PET CT scan.  2.  Family history: -Maternal aunt had breast cancer in her 20s.  Mother had  glioblastoma.  Paternal uncle had colon cancer and paternal grandmother had throat cancer. - We will consider genetic testing.  3.  Tobacco abuse: -She smokes half pack per day for 40 years.  4.  Peripheral neuropathy:  -She reported some numbness and occasional pain in both big toes and few other toes on both feet.  This is of 2 years duration.  She only had  diabetes for 3 to 4 years.  She reported having lower back problems with disc problems and sciatica.  It is not clear whether the numbness is coming from back problems or diabetes.   All questions were answered. The patient knows to call the clinic with any problems, questions or concerns.    Derek Jack, MD 07/12/19

## 2019-07-12 NOTE — Assessment & Plan Note (Addendum)
1.  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 - Mammogram and ultrasound on 06/17/2019 showed irregular mass with calcifications in the middle and anterior thirds of the left breast measuring approximately 6.2 x 3.8 x 1.6 cm.  There is skin thickening of the left breast inferiorly.  0.6 cm rounded mass just superior and posterior to the dominant irregular mass consistent with intramammary lymph node. -Ultrasound of the left axilla showed 7 axillary lymph nodes, largest lymph node measuring 2.1 x 1.2 x 1.1 cm. - Core biopsy of the left breast at 1 o'clock position consistent with IDC.  Left breast 2:00 intramammary lymph node biopsy was benign.  Left axillary lymph node biopsy consistent with invasive ductal carcinoma. - ER/PR was negative.  Ki-67 was 20%.  HER-2 was 3+ positive. -Physical examination did not show any evidence of inflammatory breast cancer.  Lymphadenopathy in the left axilla was clinically not palpable.  Freely mobile left breast mass mainly in the outer quadrants, mostly in the upper outer quadrant present.  No clinical skin thickening was present.  No palpable mass in the right breast. - She reported 10 pound weight loss since January. -I have recommended a whole-body PET CT scan for staging purposes.  We will also obtain a baseline 2D echocardiogram. -She will require neoadjuvant chemotherapy given tumor size, ER/PR negativity and HER-2 positivity. - We will also request a port placement.  We will consider doing an MRI of the breast. - I will see her back after the PET CT scan.  2.  Family history: -Maternal aunt had breast cancer in her 56s.  Mother had glioblastoma.  Paternal uncle had colon cancer and paternal grandmother had throat cancer. - We will consider genetic testing.  3.  Tobacco abuse: -She smokes half pack per day for 40 years.  4.  Peripheral neuropathy:  -She reported  some numbness and occasional pain in both big toes and few other toes on both feet.  This is of 2 years duration.  She only had diabetes for 3 to 4 years.  She reported having lower back problems with disc problems and sciatica.  It is not clear whether the numbness is coming from back problems or diabetes.

## 2019-07-12 NOTE — Progress Notes (Signed)
I met with patient during the visit today with Dr. Delton Coombes. She was here today alone. She voices concerns about her job and how long she is going to need to be out of work. Dr. Delton Coombes explained to her that a plan would be made once she has her scans.  I helped explain how we will have a better idea of which treatment options she will need once we know her staging.  She voices understanding. She was given my contact information and encouraged to call should she have any questions or concerns.

## 2019-07-12 NOTE — Addendum Note (Signed)
Addended by: Farley Ly on: 07/12/2019 05:00 PM   Modules accepted: Orders

## 2019-07-13 ENCOUNTER — Encounter: Payer: Self-pay | Admitting: General Practice

## 2019-07-13 NOTE — Progress Notes (Signed)
[facility] Psychosocial Distress Screening Clinical Social Work  Clinical Social Work was referred by distress screening protocol.  The patient scored a 10 on the Psychosocial Distress Thermometer which indicates severe distress. Clinical Social Worker contacted patient by phone to assess for distress and other psychosocial needs. New diagnosis of Invasive Ductal Carcinoma, is very concerned about her financial situation as she has been told she will be out of work for 6 - 12 months.  No short or long term disability.  [place] getting [place] Medicaid, will apply for Food Stamps when she has been out of work for 30 days.    Has been working as a Product/process development scientist.  Has been told she cannot work at all during treatment.  This is her primary source of stress - financial insecurity and "sometimes I just feel like giving up and not doing anything at all."  Reports being suicidal in the past, denies currently suicidal ideation of any kind.  Does admit to near constant anxiety.  10 out of 10.    "Very stressed, worried, scared."  Is having difficulty dealing with hair loss, financial stress, fear.  Is taking 2 Klonopins/day as her distress occurs during the day. Takes gabapentin at bedtime which is working for sleep.  Has asked for and been denied an increase in Klonopin.  Asked Jene Every, nurse navigator, to make Epic referral for [facility] [place] as patient is open to psychiatric and mental health therapy.  Asked Durene Cal to connect w her re Advertising account executive.  Referred to [facility] for help w food and non cancer medication access.  Referred to Duanne Limerick for help w small grant process and general psychosocial support.     ONCBCN DISTRESS SCREENING 07/12/2019  Screening Type Initial Screening  Distress experienced in past week (1-10) 10  Practical problem type Work/school  Emotional problem type Depression  Information Concerns Type Lack of info about diagnosis  Physician notified of physical symptoms  Yes  Referral to clinical psychology No  Referral to clinical social work No  Referral to dietition No  Referral to financial advocate No  Referral to support programs No  Referral to palliative care No    Clinical Social Worker follow up needed: Yes.    Will connect w her by phone in next 1 - 2 weeks to assess progress.    If yes, follow up plan:  See above  Beverely Pace, [place], [place] Worker Phone:  [phone] Cell:  [phone]

## 2019-07-14 ENCOUNTER — Other Ambulatory Visit (HOSPITAL_COMMUNITY): Payer: Self-pay | Admitting: *Deleted

## 2019-07-14 ENCOUNTER — Encounter: Payer: Self-pay | Admitting: General Practice

## 2019-07-14 DIAGNOSIS — F3289 Other specified depressive episodes: Secondary | ICD-10-CM

## 2019-07-14 NOTE — Progress Notes (Signed)
I received notification from Edwyna Shell , [place] requesting that patient be referred to behavior health here in [place].  Orders have been placed for medication management and therapy.

## 2019-07-14 NOTE — Progress Notes (Signed)
[facility] CSW Progress Notes  [facility] has received referral and will connect re food insecurity.  Needs clarification on what's patient's Medicaid will cover in terms of non cancer related medications and physician services, will attempt to get this answer.  Edwyna Shell, LCSW Clinical Social Worker Phone:  [phone]

## 2019-07-15 ENCOUNTER — Other Ambulatory Visit: Payer: Self-pay

## 2019-07-15 ENCOUNTER — Ambulatory Visit (HOSPITAL_COMMUNITY)
Admission: RE | Admit: 2019-07-15 | Discharge: 2019-07-15 | Disposition: A | Discharge status: Home / Self Care | Payer: Medicaid Other | Source: Ambulatory Visit | Attending: Hematology | Admitting: Hematology

## 2019-07-15 DIAGNOSIS — C50912 Malignant neoplasm of unspecified site of left female breast: Secondary | ICD-10-CM | POA: Insufficient documentation

## 2019-07-15 NOTE — Progress Notes (Signed)
*  PRELIMINARY RESULTS* Echocardiogram 2D Echocardiogram has been performed.  Tammy Rubio 07/15/2019, 11:50 AM

## 2019-07-15 NOTE — Congregational Nurse Program (Signed)
  Dept: [phone]   Congregational Nurse Program Note  Date of Encounter: 07/14/2019  Past Medical History: Past Medical History:  Diagnosis Date  . Anxiety   . Hypertension     Encounter Details: CNP Questionnaire - 07/14/19 1645      Questionnaire   Patient Status  Not Applicable    Race  White or Caucasian    Location Patient Served At  [facility]  Not Applicable    Uninsured  Uninsured (NEW 1x/quarter)    Food  Yes, have food insecurities    Housing/Utilities  Yes, have permanent housing    Transportation  No transportation needs    Interpersonal Safety  Yes, feel physically and emotionally safe where you currently live    Medication  No medication insecurities    Medical Provider  Yes    Referrals  Other;Area Agency    ED Visit Averted  Not Applicable    Life-Saving Intervention Made  Not Applicable       client is a referral to [facility]/Clara Gunn from [facility] for assistance with food insecurity. Client is newly diagnosed with breast cancer and as of next week will be unemployed for several months. 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     Client came by [employer] to get her food box we were able to provide from a donation from a local church. . Discussed her current needs. She will be getting [place] Medicaid.  Edwyna Shell is verifying if this will cover as full Medicaid.  Gave client information regarding [employer] and the date of the next one. client will be eligible for food stamps after 30 days of unemployment. last day of employment 07/18/19.  Client expresses a lot of anxiety of all the unknowns associated with her new diagnosis of invasive ductal breast cancer. client is scheduled for her pre-op procedures next week in preparation for her porta cath insertion by Dr. Constance Haw.  Client lives alone and her children are grown. she does have support and transportation.  Discussed  with client her desire to change PCP. She is currently with Dr. Lorriane Shire, we discussed that once I am able to find out about what all her Medicaid will cover, I will be able to give her names of other providers that are taking Medicaid. Client has history of anxiety, depression, hypertension and diabetes. She currently has all her medications. Her biggest concern is her medication for anxiety which PCP ordered a short course of Klonopin,  however she has used that. Per Cancer center Navigator's notes in EPIC they are working on helping with a referral to [facility] for her anxiety. client is aware that is being worked on.  Pink fund application and envelop given along with [place] assistance application which Edwyna Shell had asked that we print for her. Encouraged client to get that turned in ASAP. Client has already been referred to [facility]. Will continue to follow for PCP concerns and other resources we may assist [facility] and client with. client agreeable for follow up. depending on [facility] referral may refer to [facility] interns for supportive counseling.   Debria Garret RN

## 2019-07-18 ENCOUNTER — Other Ambulatory Visit: Payer: Self-pay

## 2019-07-18 ENCOUNTER — Encounter (HOSPITAL_COMMUNITY)
Admission: RE | Admit: 2019-07-18 | Discharge: 2019-07-18 | Disposition: A | Discharge status: Home / Self Care | Payer: Medicaid Other | Source: Ambulatory Visit | Attending: Hematology | Admitting: Hematology

## 2019-07-18 DIAGNOSIS — C50912 Malignant neoplasm of unspecified site of left female breast: Secondary | ICD-10-CM | POA: Insufficient documentation

## 2019-07-18 MED ORDER — FLUDEOXYGLUCOSE F - 18 (FDG) INJECTION
10.2800 | Freq: Once | INTRAVENOUS | Status: AC | PRN
  Administered 2019-07-18: 10.28 via INTRAVENOUS

## 2019-07-18 NOTE — H&P (Signed)
[facility] History and Physical  Reason for Referral: Left Breast Cancer  Referring Physician:  Dr. Shelly Bombard      Chief Complaint    Breast Cancer      Tammy Rubio is a 57 y.o. female.  HPI:  Tammy Rubio is a 58 yo with a newly diagnosed left breast cancer with biopsy positive lymph node after US guided biopsy.  She reports noticing a large mass in her left breast in August a few weeks before her mammogram. She had her last mammogram in 2017, and this was normal at the time. She had not noticed anything prior to August per her report.   The patient has no history of any masses, lumps, bumps, nipple changes or discharge.She had menarche at age 89, and her first pregnancy at age 40. She is G4P2. She underwent menopause at age 65. She has never had any previous biopsies or concerning areas on mammogram.  She has not had any chest radiation. She has a family history of breast cancer in her maternal grandmother and maternal aunt.  She states that her nipple has started to deviate laterally.  She is very anxious about her new findings and went to see Dr. Cindie Laroche today regarding anxiety medication.        Past Medical History:  Diagnosis Date  . Anxiety   . Hypertension          Past Surgical History:  Procedure Laterality Date  . DILATION AND CURETTAGE, DIAGNOSTIC / THERAPEUTIC    . LAPAROSCOPIC TUBAL LIGATION    . TONSILLECTOMY AND ADENOIDECTOMY           Family History  Problem Relation Age of Onset  . Breast cancer Maternal Aunt   . Breast cancer Maternal Grandmother   . Colon cancer Paternal Uncle     Social History        Tobacco Use  . Smoking status: Current Every Day Smoker    Packs/day: 0.50    Years: 15.00    Pack years: 7.50    Types: Cigarettes  . Smokeless tobacco: Never Used  Substance Use Topics  . Alcohol use: Not Currently    Frequency: Never  . Drug use: Never    Medications: I have  reviewed the patient's current medications. Allergies as of 07/05/2019   No Known Allergies        Medication List       Accurate as of July 05, 2019 11:59 PM. If you have any questions, ask your nurse or doctor.        amLODipine 10 MG tablet Commonly known as: NORVASC 10 mg.   gabapentin 300 MG capsule Commonly known as: NEURONTIN 300 mg.   ibuprofen 600 MG tablet Commonly known as: ADVIL 600 mg.   metFORMIN 500 MG (MOD) 24 hr tablet Commonly known as: GLUMETZA 500 mg.        ROS:  A comprehensive review of systems was negative except for: Ears, nose, mouth, throat, and face: positive for sinus problems Respiratory: positive for cough Gastrointestinal: positive for abdominal pain and reflux symptoms Neurological: positive for numbness Endocrine: positive for diabetic symptoms including polydipsia and temperature intolerance  Blood pressure 104/72, pulse (!) 102, temperature 97.8 F (36.6 C), temperature source Tympanic, resp. rate 16, height 5\' 3"  (1.6 m), weight 157 lb (71.2 kg), last menstrual period 08/06/2013, SpO2 95 %. Physical Exam Vitals signs reviewed.  Constitutional:      Appearance: Normal appearance.  HENT:  Head: Normocephalic and atraumatic.     Nose: Nose normal.  Eyes:     Extraocular Movements: Extraocular movements intact.     Pupils: Pupils are equal, round, and reactive to light.  Neck:     Musculoskeletal: Normal range of motion. No neck rigidity.  Cardiovascular:     Rate and Rhythm: Normal rate and regular rhythm.  Pulmonary:     Effort: Pulmonary effort is normal.     Breath sounds: Normal breath sounds.  Chest:     Breasts:        Right: Normal. No inverted nipple, mass or tenderness.        Left: Mass and tenderness present.     Comments: Left nipple slightly deviated to the left/ starting to invert; left breast mass > 6cm on upper outer breast Abdominal:     General: There is no distension.      Palpations: Abdomen is soft.     Tenderness: There is no abdominal tenderness.  Musculoskeletal: Normal range of motion.        General: No swelling.  Lymphadenopathy:     Upper Body:     Right upper body: No supraclavicular or axillary adenopathy.     Left upper body: Axillary adenopathy present. No supraclavicular adenopathy.  Skin:    General: Skin is warm and dry.  Neurological:     General: No focal deficit present.     Mental Status: She is alert and oriented to person, place, and time.  Psychiatric:        Mood and Affect: Mood normal.        Behavior: Behavior normal.        Thought Content: Thought content normal.        Judgment: Judgment normal.     Results: 06/06/2019 Mammogram Screening CLINICAL DATA: Screening.  EXAM: DIGITAL SCREENING BILATERAL MAMMOGRAM WITH TOMO AND CAD  COMPARISON: Previous exam(s).  ACR Breast Density Category b: There are scattered areas of fibroglandular density.  FINDINGS: In the right breast there possible masses which require further evaluation.  In the left breast there is a distortion with associated microcalcifications, and a separate mass. Left breast skin thickening and left axillary lymphadenopathy is also noted. These findings require further evaluation.  Images were processed with CAD.  IMPRESSION: Further evaluation is suggested for possible masses in the right breast.  Further evaluation is suggested for possible distortion/calcifications, mass in the left breast, and left axillary lymphadenopathy.  RECOMMENDATION: Diagnostic mammogram and possibly ultrasound of both breasts. (Code:FI-B-66M)  The patient will be contacted regarding the findings, and additional imaging will be scheduled.  BI-RADS CATEGORY 0: Incomplete. Need additional imaging evaluation and/or prior mammograms for comparison.   Electronically Signed By: Fidela Salisbury M.D. On: 06/07/2019 14:18  06/17/19  Mammogram Diagnostic CLINICAL DATA: 58 year old patient recalled from recent screening mammogram for evaluation of left breast distortion with associated calcifications and a mass. Left breast skin thickening and left axillary adenopathy were noted.  She was also recalled for evaluation of possible right breast masses.  In discussion with the patient today, she says that she has noticed a palpable lump in the outer left breast, but did not mention it at the time of her screening mammogram.  EXAM: DIGITAL DIAGNOSTIC BILATERAL MAMMOGRAM WITH CAD AND TOMO  ULTRASOUND BILATERAL BREAST  COMPARISON: Previous exam(s).  ACR Breast Density Category c: The breast tissue is heterogeneously dense, which may obscure small masses.  FINDINGS: An irregular mass with innumerable pleomorphic calcifications in the  middle and anterior thirds of the left breast spans approximately 6.2 x 3.8 x 1.6 cm. There is skin thickening of the left breast inferiorly. A 0.6 cm rounded mass is seen just superior and posterior to the dominant irregular mass.  Spot compression view of the retroareolar right breast with tomography show a few small circumscribed masses and dense tissue.  Mammographic images were processed with CAD.  On physical exam, there is a firm lump centered in the 2 o'clock to 2:30 axis of the left breast extending from retroareolar to approximately 6 cm distal to the nipple. I do not palpate a lump in the left axilla.  Targeted ultrasound is performed, showing markedly hypoechoic irregular mass containing internal calcifications in the 2 o'clock to 2:30 position of the left breast in the region of the palpable lump. On ultrasound, mass margins are indistinct, but the mass measures at least 6 cm greatest diameter. Just superior and lateral to the mass at approximately 2 o'clock position 6-7 cm from the nipple is an intramammary lymph node that measures 5 x 5 x 4 mm, and  has echogenic areas within its hilum that may reflect calcifications.  Ultrasound of the left axilla shows 7 (seven) axillary lymph nodes with variable but suspicious features. The largest lymph node is 2.1 x 1.2 x 1.1 cm, has a thickened cortex measuring 4.4 mm, and contains internal calcifications. This correlates with the lymph node seen on the screening mammogram. The second most suspicious appearing lymph node is completely hypoechoic, oval, and measures 1.2 x 1.5 x 0.9 cm.  Ultrasound of the right breast shows multiple benign appearing simple and complicated cysts. The right axilla is negative for lymphadenopathy.  IMPRESSION: 1. Clinical and imaging findings suggestive of locally advanced breast cancer on the left. 2. Skin thickening of the left breast. 3. 7 suspicious left axillary lymph nodes identified. 4. No evidence of malignancy in the right breast. Benign cysts.  RECOMMENDATION: A total of 3 ultrasound-guided biopsies are recommended. Biopsies of the dominant palpable left breast mass, the adjacent suspicious intramammary lymph node at 2 o'clock position, and of one of the suspicious left axillary lymph nodes. These biopsies will be scheduled for the patient.  I have discussed the findings and recommendations with the patient. If applicable, a reminder letter will be sent to the patient regarding the next appointment.  BI-RADS CATEGORY 5: Highly suggestive of malignancy.   Electronically Signed By: Curlene Dolphin M.D. On: 06/17/2019 17:01  06/17/19 [place] Left breast CLINICAL DATA: 58 year old patient recalled from recent screening mammogram for evaluation of left breast distortion with associated calcifications and a mass. Left breast skin thickening and left axillary adenopathy were noted.  She was also recalled for evaluation of possible right breast masses.  In discussion with the patient today, she says that she has noticed a palpable  lump in the outer left breast, but did not mention it at the time of her screening mammogram.  EXAM: DIGITAL DIAGNOSTIC BILATERAL MAMMOGRAM WITH CAD AND TOMO  ULTRASOUND BILATERAL BREAST  COMPARISON: Previous exam(s).  ACR Breast Density Category c: The breast tissue is heterogeneously dense, which may obscure small masses.  FINDINGS: An irregular mass with innumerable pleomorphic calcifications in the middle and anterior thirds of the left breast spans approximately 6.2 x 3.8 x 1.6 cm. There is skin thickening of the left breast inferiorly. A 0.6 cm rounded mass is seen just superior and posterior to the dominant irregular mass.  Spot compression view of the retroareolar right breast  with tomography show a few small circumscribed masses and dense tissue.  Mammographic images were processed with CAD.  On physical exam, there is a firm lump centered in the 2 o'clock to 2:30 axis of the left breast extending from retroareolar to approximately 6 cm distal to the nipple. I do not palpate a lump in the left axilla.  Targeted ultrasound is performed, showing markedly hypoechoic irregular mass containing internal calcifications in the 2 o'clock to 2:30 position of the left breast in the region of the palpable lump. On ultrasound, mass margins are indistinct, but the mass measures at least 6 cm greatest diameter. Just superior and lateral to the mass at approximately 2 o'clock position 6-7 cm from the nipple is an intramammary lymph node that measures 5 x 5 x 4 mm, and has echogenic areas within its hilum that may reflect calcifications.  Ultrasound of the left axilla shows 7 (seven) axillary lymph nodes with variable but suspicious features. The largest lymph node is 2.1 x 1.2 x 1.1 cm, has a thickened cortex measuring 4.4 mm, and contains internal calcifications. This correlates with the lymph node seen on the screening mammogram. The second most suspicious appearing  lymph node is completely hypoechoic, oval, and measures 1.2 x 1.5 x 0.9 cm.  Ultrasound of the right breast shows multiple benign appearing simple and complicated cysts. The right axilla is negative for lymphadenopathy.  IMPRESSION: 1. Clinical and imaging findings suggestive of locally advanced breast cancer on the left. 2. Skin thickening of the left breast. 3. 7 suspicious left axillary lymph nodes identified. 4. No evidence of malignancy in the right breast. Benign cysts.  RECOMMENDATION: A total of 3 ultrasound-guided biopsies are recommended. Biopsies of the dominant palpable left breast mass, the adjacent suspicious intramammary lymph node at 2 o'clock position, and of one of the suspicious left axillary lymph nodes. These biopsies will be scheduled for the patient.  I have discussed the findings and recommendations with the patient. If applicable, a reminder letter will be sent to the patient regarding the next appointment.  BI-RADS CATEGORY 5: Highly suggestive of malignancy.   Electronically Signed By: Curlene Dolphin M.D. On: 06/17/2019 17:01  06/28/19 Biopsy CLINICAL DATA: Post ultrasound-guided biopsy of a mass with associated calcifications in the left breast the 1 o'clock position, ultrasound-guided biopsy of an intramammary lymph node in the left breast at the 2 o'clock position and ultrasound-guided biopsy of a morphologically abnormal lymph node in the left axilla.  EXAM: DIAGNOSTIC LEFT MAMMOGRAM POST ULTRASOUND BIOPSY  COMPARISON: Previous exam(s).  FINDINGS: Mammographic images were obtained following ultrasound guided biopsy of a mass in the left breast at the 1 o'clock position, ultrasound-guided biopsy an intramammary lymph node in the left breast at the 2 o'clock position and ultrasound-guided biopsy of a morphologically abnormal lymph node in the left axilla. A ribbon shaped biopsy marking clip is present at the site of the  biopsied mass with associated calcifications in the left breast the 1 o'clock position. A wing shaped biopsy marking clip is present the site of the biopsied intramammary lymph node in the left breast at the 2 o'clock position. A spiral shaped HydroMARK clip is present at the site of the biopsied lymph node in the left axilla.  IMPRESSION: 1. Ribbon shaped biopsy marking clip at site of biopsied mass with associated calcifications in the left breast at the 1 o'clock position.  2. Wing shaped biopsy marking clip at site of biopsied intramammary lymph node in the left breast the  2 o'clock position.  3. Spiral shaped HydroMARK clip at site of biopsied lymph node in the left axilla.  Final Assessment: Post Procedure Mammograms for Marker Placement   Electronically Signed By: Everlean Alstrom M.D. On: 06/28/2019 13:56  ADDENDUM REPORT: 06/30/2019 14:57  ADDENDUM: PATHOLOGY revealed:  A. BREAST, LEFT, 1:00, BIOPSY: - Invasive ductal carcinoma. - Ductal carcinoma in situ. - See comment.  B. BREAST, LEFT, 2:00, INTRAMAMMARY LYMPH NODE, BIOPSY: - Benign lymph nodal tissue. - There is no evidence of malignancy.  C. LYMPH NODES, LEFT AXILLA, BIOPSY: - Invasive ductal carcinoma. -  COMMENT: A. The carcinoma appears grade 3. A breast prognostic profile will be performed and the results reported separately. COMMENT: C. No lymph nodal tissue is identified.  Pathology results are CONCORDANT with imaging findings, per Dr. Everlean Alstrom.  Pathology results were discussed with patient via telephone. The patient reported doing well after the biopsy with tenderness at the site. Post biopsy care instructions were reviewed and questions were answered. The patient was encouraged to call [facility] for any additional concerns.  Recommendation: Surgical referral. Request for surgical referral was relayed to Kathi Der RT at [facility] Mammography Department by Electa Sniff RN on 06/30/2019.  Addendum by Electa Sniff RN on 06/30/2019.   Electronically Signed By: Everlean Alstrom M.D. On: 06/30/2019 14:57  Assessment & Plan:  Tammy Rubio is a 58 y.o. female with a locally advanced left breast cancer. Clinically the mass is at least 6 cm and is 6 cm on [place].  One of the lymph nodes was biopsied positive for invasive ductal carcinoma.    -Referral to Dr. Larna Daughters for neoadjuvant therapy given the locally advanced nature of the cancer        Future Appointments  Date Time Provider [place]  07/12/2019  9:15 AM Derek Jack, MD [facility] None   We discussed that her cancer was more advanced and that she would benefit from chemoradiation treatment prior to surgery to reduce the size of the cancer. We discussed port a catheter placement and the risk of bleeding, infection, malfunction, injury to vessels, and pneumothorax.    Once she has talked with Dr. Larna Daughters, we will proceed with her port placement.   All questions were answered to the satisfaction of the patient and family.  I spent over 45 minutes with the patient discussing the breast cancer, the locally advanced nature, and her options, and discussing the plan for a port once she has seen Dr. Delton Coombes.   Tammy Rubio 07/08/2019, 11:35 PM

## 2019-07-19 ENCOUNTER — Inpatient Hospital Stay (HOSPITAL_COMMUNITY): Payer: Medicaid Other | Admitting: General Practice

## 2019-07-19 ENCOUNTER — Encounter (HOSPITAL_COMMUNITY): Payer: Self-pay | Admitting: General Practice

## 2019-07-19 ENCOUNTER — Ambulatory Visit (HOSPITAL_COMMUNITY)
Admission: RE | Admit: 2019-07-19 | Discharge: 2019-07-19 | Disposition: A | Discharge status: Home / Self Care | Payer: Medicaid Other | Source: Ambulatory Visit | Attending: Hematology | Admitting: Hematology

## 2019-07-19 DIAGNOSIS — C50912 Malignant neoplasm of unspecified site of left female breast: Secondary | ICD-10-CM | POA: Insufficient documentation

## 2019-07-19 LAB — POCT I-STAT CREATININE: Creatinine, Ser: 0.8 mg/dL (ref 0.44–1.00)

## 2019-07-19 MED ORDER — GADOBUTROL 1 MMOL/ML IV SOLN
7.0000 mL | Freq: Once | INTRAVENOUS | Status: AC | PRN
  Administered 2019-07-19: 7 mL via INTRAVENOUS

## 2019-07-19 NOTE — Progress Notes (Signed)
[facility] CSW Progress Notes  Follow up phone call w patient to assess for progress towards meeting needs.  Has met w [facility]) to address food insecurity and primary care needs.  Pt has been talking w [facility] program rep at [facility] - they are working on getting her application processed quickly - has also communicated with [facility] and patient will have access to antibiotics as needed post surgery.  Asked patient to bring in applications for outside financial assistance so CSW can review and process as needed.  Will submit completed applications on patient's behalf when ready.  Edwyna Shell, LCSW Clinical Social Worker Phone:  [phone] Cell:  [phone]

## 2019-07-19 NOTE — Patient Instructions (Signed)
Tammy Rubio  07/19/2019     @PREFPERIOPPHARMACY @   Your procedure is scheduled on  07/22/2019.  Report to [facility] at  0930  A.M.  Call this number if you have problems the morning of surgery:  [phone]   Remember:  Do not eat or drink after midnight.                       Take these medicines the morning of surgery with A SIP OF WATER  Amlodipine, clonazepam, pepcid.    Do not wear jewelry, make-up or nail polish.  Do not wear lotions, powders, or perfumes, or deodorant. Please brush your teeth.  Do not shave 48 hours prior to surgery.  Men may shave face and neck.  Do not bring valuables to the hospital.  [facility] is not responsible for any belongings or valuables.  Contacts, dentures or bridgework may not be worn into surgery.  Leave your suitcase in the car.  After surgery it may be brought to your room.  For patients admitted to the hospital, discharge time will be determined by your treatment team.  Patients discharged the day of surgery will not be allowed to drive home.   Name and phone number of your driver:   family Special instructions:  None  Please read over the following fact sheets that you were given. Anesthesia Post-op Instructions and Care and Recovery After Surgery       Implanted Port Insertion Implanted port insertion is a procedure to put in a port and catheter. The port is a device with an injectable disk that can be accessed by your health care provider. The port is connected to a vein in the chest or neck by a small flexible tube (catheter). There are different types of ports. The implanted port may be used as a long-term IV access for:  Medicines, such as chemotherapy.  Fluids.  Liquid nutrition, such as total parenteral nutrition (TPN). When you have a port, this means that your health care provider will not need to use the veins in your arms for these procedures. Tell a health care provider about:  Any  allergies you have.  All medicines you are taking, especially blood thinners, as well as any vitamins, herbs, eye drops, creams, over-the-counter medicines, and steroids.  Any problems you or family members have had with anesthetic medicines.  Any blood disorders you have.  Any surgeries you have had.  Any medical conditions you have or have had, including diabetes or kidney problems.  Whether you are pregnant or may be pregnant. What are the risks? Generally, this is a safe procedure. However, problems may occur, including:  Allergic reactions to medicines or dyes.  Damage to other structures or organs.  Infection.  Damage to the blood vessel, bruising, or bleeding at the puncture site.  Blood clot.  Breakdown of the skin over the port.  A collection of air in the chest that can cause one of the lungs to collapse (pneumothorax). This is rare. What happens before the procedure? Medicines  Ask your health care provider about: ? Changing or stopping your regular medicines. This is especially important if you are taking diabetes medicines or blood thinners. ? Taking medicines such as aspirin and ibuprofen. These medicines can thin your blood. Do not take these medicines unless your health care provider tells you to take them. ? Taking over-the-counter medicines, vitamins,  herbs, and supplements. Staying hydrated Follow instructions from your health care provider about hydration, which may include:  Up to 2 hours before the procedure - you may continue to drink clear liquids, such as water, clear fruit juice, black coffee, and plain tea.  Eating and drinking restrictions  Follow instructions from your health care provider about eating and drinking, which may include: ? 8 hours before the procedure - stop eating heavy meals or foods, such as meat, fried foods, or fatty foods. ? 6 hours before the procedure - stop eating light meals or foods, such as toast or cereal. ? 6 hours  before the procedure - stop drinking milk or drinks that contain milk. ? 2 hours before the procedure - stop drinking clear liquids. General instructions  Plan to have someone take you home from the hospital or clinic.  If you will be going home right after the procedure, plan to have someone with you for 24 hours.  You may have blood tests.  Do not use any products that contain nicotine or tobacco for at least 4-6 weeks before the procedure. These products include cigarettes, e-cigarettes, and chewing tobacco. If you need help quitting, ask your health care provider.  Ask your health care provider what steps will be taken to help prevent infection. These may include: ? Removing hair at the surgery site. ? Washing skin with a germ-killing soap. ? Taking antibiotic medicine. What happens during the procedure?   An IV will be inserted into one of your veins.  You will be given one or more of the following: ? A medicine to help you relax (sedative). ? A medicine to numb the area (local anesthetic).  Two small incisions will be made to insert the port. ? One smaller incision will be made in your neck to get access to the vein where the catheter will lie. ? The other incision will be made in the upper chest. This is where the port will lie.  The procedure may be done using continuous X-ray (fluoroscopy) or other imaging tools for guidance.  The port and catheter will be placed. There may be a small, raised area where the port is.  The port will be flushed with a salt solution (saline), and blood will be drawn to make sure that it is working correctly.  The incisions will be closed.  Bandages (dressings) may be placed over the incisions. The procedure may vary among health care providers and hospitals. What happens after the procedure?  Your blood pressure, heart rate, breathing rate, and blood oxygen level will be monitored until you leave the hospital or clinic.  Do not drive  for 24 hours if you were given a sedative during your procedure.  You will be given a manufacturer's information card for the type of port that you have. Keep this with you.  Your port will need to be flushed and checked as told by your health care provider, usually every few weeks.  A chest X-ray will be done to: ? Check the placement of the port. ? Make sure there is no injury to your lung. Summary  Implanted port insertion is a procedure to put in a port and catheter.  The implanted port is used as a long-term IV access.  The port will need to be flushed and checked as told by your health care provider, usually every few weeks.  Keep your manufacturer's information card with you at all times. This information is not intended to replace advice  given to you by your health care provider. Make sure you discuss any questions you have with your health care provider. Document Released: 07/13/2013 Document Revised: 01/14/2019 Document Reviewed: 04/20/2018 Elsevier Patient Education  [place] Insertion, Care After This sheet gives you information about how to care for yourself after your procedure. Your health care provider may also give you more specific instructions. If you have problems or questions, contact your health care provider. What can I expect after the procedure? After the procedure, it is common to have:  Discomfort at the port insertion site.  Bruising on the skin over the port. This should improve over 3-4 days. Follow these instructions at home: [facility] care  After your port is placed, you will get a manufacturer's information card. The card has information about your port. Keep this card with you at all times.  Take care of the port as told by your health care provider. Ask your health care provider if you or a family member can get training for taking care of the port at home. A home health care nurse may also take care of the port.  Make sure  to remember what type of port you have. Incision care      Follow instructions from your health care provider about how to take care of your port insertion site. Make sure you: ? Wash your hands with soap and water before and after you change your bandage (dressing). If soap and water are not available, use hand sanitizer. ? Change your dressing as told by your health care provider. ? Leave stitches (sutures), skin glue, or adhesive strips in place. These skin closures may need to stay in place for 2 weeks or longer. If adhesive strip edges start to loosen and curl up, you may trim the loose edges. Do not remove adhesive strips completely unless your health care provider tells you to do that.  Check your port insertion site every day for signs of infection. Check for: ? Redness, swelling, or pain. ? Fluid or blood. ? Warmth. ? Pus or a bad smell. Activity  Return to your normal activities as told by your health care provider. Ask your health care provider what activities are safe for you.  Do not lift anything that is heavier than 10 lb (4.5 kg), or the limit that you are told, until your health care provider says that it is safe. General instructions  Take over-the-counter and prescription medicines only as told by your health care provider.  Do not take baths, swim, or use a hot tub until your health care provider approves. Ask your health care provider if you may take showers. You may only be allowed to take sponge baths.  Do not drive for 24 hours if you were given a sedative during your procedure.  Wear a medical alert bracelet in case of an emergency. This will tell any health care providers that you have a port.  Keep all follow-up visits as told by your health care provider. This is important. Contact a health care provider if:  You cannot flush your port with saline as directed, or you cannot draw blood from the port.  You have a fever or chills.  You have redness,  swelling, or pain around your port insertion site.  You have fluid or blood coming from your port insertion site.  Your port insertion site feels warm to the touch.  You have pus or a bad smell coming from the port  insertion site. Get help right away if:  You have chest pain or shortness of breath.  You have bleeding from your port that you cannot control. Summary  Take care of the port as told by your health care provider. Keep the manufacturer's information card with you at all times.  Change your dressing as told by your health care provider.  Contact a health care provider if you have a fever or chills or if you have redness, swelling, or pain around your port insertion site.  Keep all follow-up visits as told by your health care provider. This information is not intended to replace advice given to you by your health care provider. Make sure you discuss any questions you have with your health care provider. Document Released: 07/13/2013 Document Revised: 04/20/2018 Document Reviewed: 04/20/2018 Elsevier Patient Education  [place] An implanted port is a device that is placed under the skin. It is usually placed in the chest. The device can be used to give IV medicine, to take blood, or for dialysis. You may have an implanted port if:  You need IV medicine that would be irritating to the small veins in your hands or arms.  You need IV medicines, such as antibiotics, for a long period of time.  You need IV nutrition for a long period of time.  You need dialysis. Having a port means that your health care provider will not need to use the veins in your arms for these procedures. You may have fewer limitations when using a port than you would if you used other types of long-term IVs, and you will likely be able to return to normal activities after your incision heals. An implanted port has two main parts:  Reservoir. The reservoir is the part  where a needle is inserted to give medicines or draw blood. The reservoir is round. After it is placed, it appears as a small, raised area under your skin.  Catheter. The catheter is a thin, flexible tube that connects the reservoir to a vein. Medicine that is inserted into the reservoir goes into the catheter and then into the vein. How is my port accessed? To access your port:  A numbing cream may be placed on the skin over the port site.  Your health care provider will put on a mask and sterile gloves.  The skin over your port will be cleaned carefully with a germ-killing soap and allowed to dry.  Your health care provider will gently pinch the port and insert a needle into it.  Your health care provider will check for a blood return to make sure the port is in the vein and is not clogged.  If your port needs to remain accessed to get medicine continuously (constant infusion), your health care provider will place a clear bandage (dressing) over the needle site. The dressing and needle will need to be changed every week, or as told by your health care provider. What is flushing? Flushing helps keep the port from getting clogged. Follow instructions from your health care provider about how and when to flush the port. Ports are usually flushed with saline solution or a medicine called heparin. The need for flushing will depend on how the port is used:  If the port is only used from time to time to give medicines or draw blood, the port may need to be flushed: ? Before and after medicines have been given. ? Before and after blood has  been drawn. ? As part of routine maintenance. Flushing may be recommended every 4-6 weeks.  If a constant infusion is running, the port may not need to be flushed.  Throw away any syringes in a disposal container that is meant for sharp items (sharps container). You can buy a sharps container from a pharmacy, or you can make one by using an empty hard plastic  bottle with a cover. How long will my port stay implanted? The port can stay in for as long as your health care provider thinks it is needed. When it is time for the port to come out, a surgery will be done to remove it. The surgery will be similar to the procedure that was done to put the port in. Follow these instructions at home:   Flush your port as told by your health care provider.  If you need an infusion over several days, follow instructions from your health care provider about how to take care of your port site. Make sure you: ? Wash your hands with soap and water before you change your dressing. If soap and water are not available, use alcohol-based hand sanitizer. ? Change your dressing as told by your health care provider. ? Place any used dressings or infusion bags into a plastic bag. Throw that bag in the trash. ? Keep the dressing that covers the needle clean and dry. Do not get it wet. ? Do not use scissors or sharp objects near the tube. ? Keep the tube clamped, unless it is being used.  Check your port site every day for signs of infection. Check for: ? Redness, swelling, or pain. ? Fluid or blood. ? Pus or a bad smell.  Protect the skin around the port site. ? Avoid wearing bra straps that rub or irritate the site. ? Protect the skin around your port from seat belts. Place a soft pad over your chest if needed.  Bathe or shower as told by your health care provider. The site may get wet as long as you are not actively receiving an infusion.  Return to your normal activities as told by your health care provider. Ask your health care provider what activities are safe for you.  Carry a medical alert card or wear a medical alert bracelet at all times. This will let health care providers know that you have an implanted port in case of an emergency. Get help right away if:  You have redness, swelling, or pain at the port site.  You have fluid or blood coming from your  port site.  You have pus or a bad smell coming from the port site.  You have a fever. Summary  Implanted ports are usually placed in the chest for long-term IV access.  Follow instructions from your health care provider about flushing the port and changing bandages (dressings).  Take care of the area around your port by avoiding clothing that puts pressure on the area, and by watching for signs of infection.  Protect the skin around your port from seat belts. Place a soft pad over your chest if needed.  Get help right away if you have a fever or you have redness, swelling, pain, drainage, or a bad smell at the port site. This information is not intended to replace advice given to you by your health care provider. Make sure you discuss any questions you have with your health care provider. Document Released: 09/22/2005 Document Revised: 01/14/2019 Document Reviewed: 10/25/2016 Elsevier Patient Education  [place] After These instructions provide you with information about caring for yourself after your procedure. Your health care provider may also give you more specific instructions. Your treatment has been planned according to current medical practices, but problems sometimes occur. Call your health care provider if you have any problems or questions after your procedure. What can I expect after the procedure? After your procedure, you may:  Feel sleepy for several hours.  Feel clumsy and have poor balance for several hours.  Feel forgetful about what happened after the procedure.  Have poor judgment for several hours.  Feel nauseous or vomit.  Have a sore throat if you had a breathing tube during the procedure. Follow these instructions at home: For at least 24 hours after the procedure:      Have a responsible adult stay with you. It is important to have someone help care for you until you are awake and alert.  Rest as  needed.  Do not: ? Participate in activities in which you could fall or become injured. ? Drive. ? Use heavy machinery. ? Drink alcohol. ? Take sleeping pills or medicines that cause drowsiness. ? Make important decisions or sign legal documents. ? Take care of children on your own. Eating and drinking  Follow the diet that is recommended by your health care provider.  If you vomit, drink water, juice, or soup when you can drink without vomiting.  Make sure you have little or no nausea before eating solid foods. General instructions  Take over-the-counter and prescription medicines only as told by your health care provider.  If you have sleep apnea, surgery and certain medicines can increase your risk for breathing problems. Follow instructions from your health care provider about wearing your sleep device: ? Anytime you are sleeping, including during daytime naps. ? While taking prescription pain medicines, sleeping medicines, or medicines that make you drowsy.  If you smoke, do not smoke without supervision.  Keep all follow-up visits as told by your health care provider. This is important. Contact a health care provider if:  You keep feeling nauseous or you keep vomiting.  You feel light-headed.  You develop a rash.  You have a fever. Get help right away if:  You have trouble breathing. Summary  For several hours after your procedure, you may feel sleepy and have poor judgment.  Have a responsible adult stay with you for at least 24 hours or until you are awake and alert. This information is not intended to replace advice given to you by your health care provider. Make sure you discuss any questions you have with your health care provider. Document Released: 01/13/2016 Document Revised: 12/21/2017 Document Reviewed: 01/13/2016 Elsevier Patient Education  2020 [employer]. How to Use Chlorhexidine for Bathing Chlorhexidine gluconate (CHG) is a germ-killing  (antiseptic) solution that is used to clean the skin. It can get rid of the bacteria that normally live on the skin and can keep them away for about 24 hours. To clean your skin with CHG, you may be given:  A CHG solution to use in the shower or as part of a sponge bath.  A prepackaged cloth that contains CHG. Cleaning your skin with CHG may help lower the risk for infection:  While you are staying in the intensive care unit of the hospital.  If you have a vascular access, such as a central line, to provide short-term or long-term access to your veins.  If  you have a catheter to drain urine from your bladder.  If you are on a ventilator. A ventilator is a machine that helps you breathe by moving air in and out of your lungs.  After surgery. What are the risks? Risks of using CHG include:  A skin reaction.  Hearing loss, if CHG gets in your ears.  Eye injury, if CHG gets in your eyes and is not rinsed out.  The CHG product catching fire. Make sure that you avoid smoking and flames after applying CHG to your skin. Do not use CHG:  If you have a chlorhexidine allergy or have previously reacted to chlorhexidine.  On babies younger than 36 months of age. How to use CHG solution  Use CHG only as told by your health care provider, and follow the instructions on the label.  Use the full amount of CHG as directed. Usually, this is one bottle. During a shower Follow these steps when using CHG solution during a shower (unless your health care provider gives you different instructions): 1. Start the shower. 2. Use your normal soap and shampoo to wash your face and hair. 3. Turn off the shower or move out of the shower stream. 4. Pour the CHG onto a clean washcloth. Do not use any type of brush or rough-edged sponge. 5. Starting at your neck, lather your body down to your toes. Make sure you follow these instructions: ? If you will be having surgery, pay special attention to the part of  your body where you will be having surgery. Scrub this area for at least 1 minute. ? Do not use CHG on your head or face. If the solution gets into your ears or eyes, rinse them well with water. ? Avoid your genital area. ? Avoid any areas of skin that have broken skin, cuts, or scrapes. ? Scrub your back and under your arms. Make sure to wash skin folds. 6. Let the lather sit on your skin for 1-2 minutes or as long as told by your health care provider. 7. Thoroughly rinse your entire body in the shower. Make sure that all body creases and crevices are rinsed well. 8. Dry off with a clean towel. Do not put any substances on your body afterward--such as powder, lotion, or perfume--unless you are told to do so by your health care provider. Only use lotions that are recommended by the manufacturer. 9. Put on clean clothes or pajamas. 10. If it is the night before your surgery, sleep in clean sheets.  During a sponge bath Follow these steps when using CHG solution during a sponge bath (unless your health care provider gives you different instructions): 1. Use your normal soap and shampoo to wash your face and hair. 2. Pour the CHG onto a clean washcloth. 3. Starting at your neck, lather your body down to your toes. Make sure you follow these instructions: ? If you will be having surgery, pay special attention to the part of your body where you will be having surgery. Scrub this area for at least 1 minute. ? Do not use CHG on your head or face. If the solution gets into your ears or eyes, rinse them well with water. ? Avoid your genital area. ? Avoid any areas of skin that have broken skin, cuts, or scrapes. ? Scrub your back and under your arms. Make sure to wash skin folds. 4. Let the lather sit on your skin for 1-2 minutes or as long as told by  your health care provider. 5. Using a different clean, wet washcloth, thoroughly rinse your entire body. Make sure that all body creases and crevices are  rinsed well. 6. Dry off with a clean towel. Do not put any substances on your body afterward--such as powder, lotion, or perfume--unless you are told to do so by your health care provider. Only use lotions that are recommended by the manufacturer. 7. Put on clean clothes or pajamas. 8. If it is the night before your surgery, sleep in clean sheets. How to use CHG prepackaged cloths  Only use CHG cloths as told by your health care provider, and follow the instructions on the label.  Use the CHG cloth on clean, dry skin.  Do not use the CHG cloth on your head or face unless your health care provider tells you to.  When washing with the CHG cloth: ? Avoid your genital area. ? Avoid any areas of skin that have broken skin, cuts, or scrapes. Before surgery Follow these steps when using a CHG cloth to clean before surgery (unless your health care provider gives you different instructions): 1. Using the CHG cloth, vigorously scrub the part of your body where you will be having surgery. Scrub using a back-and-forth motion for 3 minutes. The area on your body should be completely wet with CHG when you are done scrubbing. 2. Do not rinse. Discard the cloth and let the area air-dry. Do not put any substances on the area afterward, such as powder, lotion, or perfume. 3. Put on clean clothes or pajamas. 4. If it is the night before your surgery, sleep in clean sheets.  For general bathing Follow these steps when using CHG cloths for general bathing (unless your health care provider gives you different instructions). 1. Use a separate CHG cloth for each area of your body. Make sure you wash between any folds of skin and between your fingers and toes. Wash your body in the following order, switching to a new cloth after each step: ? The front of your neck, shoulders, and chest. ? Both of your arms, under your arms, and your hands. ? Your stomach and groin area, avoiding the genitals. ? Your right leg and  foot. ? Your left leg and foot. ? The back of your neck, your back, and your buttocks. 2. Do not rinse. Discard the cloth and let the area air-dry. Do not put any substances on your body afterward--such as powder, lotion, or perfume--unless you are told to do so by your health care provider. Only use lotions that are recommended by the manufacturer. 3. Put on clean clothes or pajamas. Contact a health care provider if:  Your skin gets irritated after scrubbing.  You have questions about using your solution or cloth. Get help right away if:  Your eyes become very red or swollen.  Your eyes itch badly.  Your skin itches badly and is red or swollen.  Your hearing changes.  You have trouble seeing.  You have swelling or tingling in your mouth or throat.  You have trouble breathing.  You swallow any chlorhexidine. Summary  Chlorhexidine gluconate (CHG) is a germ-killing (antiseptic) solution that is used to clean the skin. Cleaning your skin with CHG may help to lower your risk for infection.  You may be given CHG to use for bathing. It may be in a bottle or in a prepackaged cloth to use on your skin. Carefully follow your health care provider's instructions and the instructions on the  product label.  Do not use CHG if you have a chlorhexidine allergy.  Contact your health care provider if your skin gets irritated after scrubbing. This information is not intended to replace advice given to you by your health care provider. Make sure you discuss any questions you have with your health care provider. Document Released: 06/16/2012 Document Revised: 12/09/2018 Document Reviewed: 08/20/2017 Elsevier Patient Education  2020 [employer].

## 2019-07-20 ENCOUNTER — Inpatient Hospital Stay (HOSPITAL_BASED_OUTPATIENT_CLINIC_OR_DEPARTMENT_OTHER): Payer: Medicaid Other | Admitting: Hematology

## 2019-07-20 ENCOUNTER — Encounter (HOSPITAL_COMMUNITY): Payer: Self-pay | Admitting: *Deleted

## 2019-07-20 ENCOUNTER — Other Ambulatory Visit: Payer: Self-pay

## 2019-07-20 ENCOUNTER — Telehealth (HOSPITAL_COMMUNITY): Payer: Self-pay | Admitting: Hematology

## 2019-07-20 ENCOUNTER — Encounter (HOSPITAL_COMMUNITY): Payer: Self-pay | Admitting: Hematology

## 2019-07-20 ENCOUNTER — Other Ambulatory Visit (HOSPITAL_COMMUNITY): Payer: Self-pay

## 2019-07-20 DIAGNOSIS — C50912 Malignant neoplasm of unspecified site of left female breast: Secondary | ICD-10-CM

## 2019-07-20 DIAGNOSIS — Z5111 Encounter for antineoplastic chemotherapy: Secondary | ICD-10-CM | POA: Diagnosis not present

## 2019-07-20 NOTE — Assessment & Plan Note (Signed)
1.  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 - Mammogram and ultrasound on 06/17/2019 showed irregular mass with calcifications in the middle and anterior thirds of the left breast measuring approximately 6.2 x 3.8 x 1.6 cm.  There is skin thickening of the left breast inferiorly.  0.6 cm rounded mass just superior and posterior to the dominant irregular mass consistent with intramammary lymph node. -Ultrasound of the left axilla showed 7 axillary lymph nodes, largest lymph node measuring 2.1 x 1.2 x 1.1 cm. - Core biopsy of the left breast at 1 o'clock position consistent with IDC.  Left breast 2:00 intramammary lymph node biopsy was benign.  Left axillary lymph node biopsy consistent with invasive ductal carcinoma. - ER/PR was negative.  Ki-67 was 20%.  HER-2 was 3+ positive. -Physical examination did not show any evidence of inflammatory breast cancer.  Lymphadenopathy in the left axilla was clinically not palpable.  Freely mobile left breast mass mainly in the outer quadrants, mostly in the upper outer quadrant present.  No clinical skin thickening was present.  No palpable mass in the right breast. - She reported 10 pound weight loss since January. - PET scan on 07/18/2019 showed 5.6 x 2.5 cm left central breast mass with SUV 5.4.  Left axillary nodal metastasis measuring 11 mm short axis.  Small left subpectoral nodes measuring up to 6 mm short axis. - Breast MRI showed 5.6 cm mass in the lateral aspect of the left breast extending to the base of the left nipple causing left nipple retraction.  Total of 8 abnormal left axillary lymph nodes, 1 of which was level 2 lymph node.  No abnormality in the right breast or axilla. - We talked about neoadjuvant chemotherapy followed by surgical resection followed by radiation therapy with continuation of anti-HER-2 therapy for a total of 1 year. -We talked about  chemotherapy options including TCH P regimen and AC THP regimen, the side effects in detail.  Because of long history of data on ACTH P regimen, we opted for it. - We will start her on dose dense AC with Neulasta support.  We will closely monitor her as she has some numbness in the great toes of both feet from diabetes when she receives paclitaxel. -2D echo on 07/15/2019 shows EF of 60 to 65%. -We will plan to start her treatment next week.  We will see her back 1 week after the treatment.  2.  Family history: -Maternal aunt had breast cancer in her 50s.  Mother had glioblastoma.  Paternal uncle had colon cancer and paternal grandmother had throat cancer. - We will consider genetic testing.  3.  Tobacco abuse: -She smokes half pack per day for 40 years.  4.  Peripheral neuropathy:  -She reported some numbness and occasional pain in both big toes and few other toes on both feet.  This is of 2 years duration.  She only had diabetes for 3 to 4 years.  She reported having lower back problems with disc problems and sciatica.  It is not clear whether the numbness is coming from back problems or diabetes. 

## 2019-07-20 NOTE — Telephone Encounter (Signed)
Spk with pt regarding the one time [employer]. Pt states she is single with no dependants and makes less than $25,520 . She stated she would bring in a W2 on 10/19 as proof of income. Advised her of the eligible expense. She states she does not have a lease agreement and her land lord does not give her receipts. Advised her unless we have a w9 and a lease agreement we could not pay her rent.

## 2019-07-20 NOTE — Progress Notes (Signed)
START ON PATHWAY REGIMEN - Breast     A cycle is every 14 days:     Doxorubicin      Cyclophosphamide    A cycle is every 21 days:     Trastuzumab-xxxx      Trastuzumab-xxxx      Pertuzumab      Pertuzumab      Paclitaxel   **Always confirm dose/schedule in your pharmacy ordering system**  Patient Characteristics: Preoperative or Nonsurgical Candidate (Clinical Staging), Neoadjuvant Therapy followed by Surgery, Invasive Disease, Chemotherapy, HER2 Positive, ER Negative/Unknown Therapeutic Status: Preoperative or Nonsurgical Candidate (Clinical Staging) AJCC M Category: cM0 AJCC Grade: G3 Breast Surgical Plan: Neoadjuvant Therapy followed by Surgery ER Status: Negative (-) AJCC 8 Stage Grouping: IIIA HER2 Status: Positive (+) AJCC T Category: cT3 AJCC N Category: cN1 PR Status: Negative (-) Intent of Therapy: Curative Intent, Discussed with Patient

## 2019-07-20 NOTE — Patient Instructions (Addendum)
[place] at [facility] Discharge Instructions  You were seen today by Dr. Delton Coombes. He went over your recent test results. He will see you back as scheduled labs, treatment and follow up.   Thank you for choosing [place] at [facility] to provide your oncology and hematology care.  To afford each patient quality time with our provider, please arrive at least 15 minutes before your scheduled appointment time.   If you have a lab appointment with the [facility] please come in thru the  Main Entrance and check in at the main information desk  You need to re-schedule your appointment should you arrive 10 or more minutes late.  We strive to give you quality time with our providers, and arriving late affects you and other patients whose appointments are after yours.  Also, if you no show three or more times for appointments you may be dismissed from the clinic at the providers discretion.     Again, thank you for choosing [facility].  Our hope is that these requests will decrease the amount of time that you wait before being seen by our physicians.       _____________________________________________________________  Should you have questions after your visit to [facility], please contact our office at [phone] between the hours of 8:00 a.m. and 4:30 p.m.  Voicemails left after 4:00 p.m. will not be returned until the following business day.  For prescription refill requests, have your pharmacy contact our office and allow 72 hours.    Cancer Center Support Programs:   > Cancer Support Group  2nd Tuesday of the month 1pm-2pm, Journey Room

## 2019-07-20 NOTE — Progress Notes (Signed)
[place], [place] [address]   CLINIC:  Medical Oncology/Hematology  PCP:  Lucia Gaskins, [place] [address] [phone]   REASON FOR VISIT:  Follow-up for left breast cancer.  CURRENT THERAPY: To initiate neoadjuvant chemotherapy.    INTERVAL HISTORY:  Tammy Rubio 58 y.o. female seen for follow-up of her left breast cancer.  She underwent PET CT scan and breast MRI.  She also underwent 2D echocardiogram.  Denies any symptoms of PND or orthopnea.  Reports appetite of 100% and energy levels 50%.  Numbness in the toes has been stable.  Denies any prior cardiac history.    REVIEW OF SYSTEMS:  Review of Systems  Neurological: Positive for numbness.  All other systems reviewed and are negative.    PAST MEDICAL/SURGICAL HISTORY:  Past Medical History:  Diagnosis Date  . Anxiety   . Hypertension    Past Surgical History:  Procedure Laterality Date  . DILATION AND CURETTAGE, DIAGNOSTIC / THERAPEUTIC    . LAPAROSCOPIC TUBAL LIGATION    . TONSILLECTOMY AND ADENOIDECTOMY       SOCIAL HISTORY:  Social History   Socioeconomic History  . Marital status: Divorced    Spouse name: Not on file  . Number of children: 2  . Years of education: Not on file  . Highest education level: Not on file  Occupational History  . Not on file  Social Needs  . Financial resource strain: Somewhat hard  . Food insecurity    Worry: Often true    Inability: Sometimes true  . Transportation needs    Medical: No    Non-medical: No  Tobacco Use  . Smoking status: Current Every Day Smoker    Packs/day: 0.50    Years: 15.00    Pack years: 7.50    Types: Cigarettes  . Smokeless tobacco: Never Used  Substance and Sexual Activity  . Alcohol use: Not Currently    Frequency: Never  . Drug use: Never  . Sexual activity: Yes  Lifestyle  . Physical activity    Days per week: 3 days    Minutes per session: 30 min  .  Stress: Only a little  Relationships  . Social Herbalist on phone: Once a week    Gets together: Twice a week    Attends religious service: Never    Active member of club or organization: No    Attends meetings of clubs or organizations: Never    Relationship status: Divorced  . Intimate partner violence    Fear of current or ex partner: No    Emotionally abused: No    Physically abused: No    Forced sexual activity: No  Other Topics Concern  . Not on file  Social History Narrative  . Not on file    FAMILY HISTORY:  Family History  Problem Relation Age of Onset  . Breast cancer Maternal Aunt   . Breast cancer Maternal Grandmother   . Colon cancer Paternal Uncle   . Hypertension Mother   . Hypercholesterolemia Mother   . Brain cancer Mother   . Diabetes Father   . Hypertension Father   . Diabetes Sister   . Diabetes Brother   . Hypertension Brother   . Diabetes Brother   . Hypertension Brother   . Healthy Son   . Healthy Son     CURRENT MEDICATIONS:  Outpatient Encounter Medications as of 07/20/2019  Medication Sig  Note  . amLODipine (NORVASC) 10 MG tablet Take 10 mg by mouth daily.    . diphenhydrAMINE (BENADRYL) 25 MG tablet Take 25 mg by mouth daily as needed for allergies. 07/18/2019: Has 25 mg prn for allergies and 50 mg daily for sleep  . diphenhydrAMINE (BENADRYL) 50 MG tablet Take 50 mg by mouth at bedtime. 07/18/2019: Has 25 mg prn for allergies and 50 mg daily for sleep  . gabapentin (NEURONTIN) 300 MG capsule Take 300 mg by mouth at bedtime.    Mar[place] lisinopril-hydrochlorothiazide (ZESTORETIC) 20-12.5 MG tablet Take 1 tablet by mouth daily.   . metFORMIN (GLUCOPHAGE) 500 MG tablet Take 500 mg by mouth 2 (two) times daily.   . [DISCONTINUED] clonazePAM (KLONOPIN) 1 MG tablet Take 1 mg by mouth 2 (two) times daily.  07/18/2019: Pt was prescribed to take qhs, pt needs it twice daily.  Pt requesting more but needs to see dr first  . acetaminophen  (TYLENOL) 500 MG tablet Take 1,000 mg by mouth every 6 (six) hours as needed for moderate pain or headache.   . [DISCONTINUED] famotidine (PEPCID) 20 MG tablet Take 20 mg by mouth See admin instructions. Take 20 mg in the morning, may take a second 20 mg dose as needed for heartburn    No facility-administered encounter medications on file as of 07/20/2019.     ALLERGIES:  Allergies  Allergen Reactions  . Tramadol Nausea And Vomiting     PHYSICAL EXAM:  ECOG Performance status: 1  Vitals:   07/20/19 1135  BP: 118/74  Pulse: (!) 119  Resp: 20  Temp: 97.9 F (36.6 C)  SpO2: 96%   Filed Weights   07/20/19 1135  Weight: 157 lb 11.2 oz (71.5 kg)    Physical Exam Vitals signs reviewed.  Constitutional:      Appearance: Normal appearance.  Cardiovascular:     Rate and Rhythm: Normal rate and regular rhythm.     Heart sounds: Normal heart sounds.  Pulmonary:     Effort: Pulmonary effort is normal.     Breath sounds: Normal breath sounds.  Abdominal:     General: There is no distension.     Palpations: Abdomen is soft. There is no mass.  Skin:    General: Skin is warm.  Neurological:     General: No focal deficit present.     Mental Status: She is alert and oriented to person, place, and time.  Psychiatric:        Mood and Affect: Mood normal.        Behavior: Behavior normal.    Breast exam was deferred.  LABORATORY DATA:  I have reviewed the labs as listed.  CBC No results found for: WBC, RBC, HGB, HCT, PLT, MCV, MCH, MCHC, RDW, LYMPHSABS, MONOABS, EOSABS, BASOSABS CMP Latest Ref Rng & Units 07/19/2019  Creatinine 0.44 - 1.00 mg/dL 0.80       DIAGNOSTIC IMAGING:  I have independently reviewed the scans and discussed with the patient.     ASSESSMENT & PLAN:   Locally advanced carcinoma of breast, left (HCC) 1.  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 -  Mammogram and ultrasound on 06/17/2019 showed irregular mass with calcifications in the middle and anterior thirds of the left breast measuring approximately 6.2 x 3.8 x 1.6 cm.  There is skin thickening of the left breast inferiorly.  0.6 cm rounded  mass just superior and posterior to the dominant irregular mass consistent with intramammary lymph node. -Ultrasound of the left axilla showed 7 axillary lymph nodes, largest lymph node measuring 2.1 x 1.2 x 1.1 cm. - Core biopsy of the left breast at 1 o'clock position consistent with IDC.  Left breast 2:00 intramammary lymph node biopsy was benign.  Left axillary lymph node biopsy consistent with invasive ductal carcinoma. - ER/PR was negative.  Ki-67 was 20%.  HER-2 was 3+ positive. -Physical examination did not show any evidence of inflammatory breast cancer.  Lymphadenopathy in the left axilla was clinically not palpable.  Freely mobile left breast mass mainly in the outer quadrants, mostly in the upper outer quadrant present.  No clinical skin thickening was present.  No palpable mass in the right breast. - She reported 10 pound weight loss since January. - PET scan on 07/18/2019 showed 5.6 x 2.5 cm left central breast mass with SUV 5.4.  Left axillary nodal metastasis measuring 11 mm short axis.  Small left subpectoral nodes measuring up to 6 mm short axis. - Breast MRI showed 5.6 cm mass in the lateral aspect of the left breast extending to the base of the left nipple causing left nipple retraction.  Total of 8 abnormal left axillary lymph nodes, 1 of which was level 2 lymph node.  No abnormality in the right breast or axilla. - We talked about neoadjuvant chemotherapy followed by surgical resection followed by radiation therapy with continuation of anti-HER-2 therapy for a total of 1 year. -We talked about chemotherapy options including TCH P regimen and AC THP regimen, the side effects in detail.  Because of long history of data on ACTH P regimen, we  opted for it. - We will start her on dose dense AC with Neulasta support.  We will closely monitor her as she has some numbness in the great toes of both feet from diabetes when she receives paclitaxel. -2D echo on 07/15/2019 shows EF of 60 to 65%. -We will plan to start her treatment next week.  We will see her back 1 week after the treatment.  2.  Family history: -Maternal aunt had breast cancer in her 2s.  Mother had glioblastoma.  Paternal uncle had colon cancer and paternal grandmother had throat cancer. - We will consider genetic testing.  3.  Tobacco abuse: -She smokes half pack per day for 40 years.  4.  Peripheral neuropathy:  -She reported some numbness and occasional pain in both big toes and few other toes on both feet.  This is of 2 years duration.  She only had diabetes for 3 to 4 years.  She reported having lower back problems with disc problems and sciatica.  It is not clear whether the numbness is coming from back problems or diabetes.   Total time spent is 40 minutes with more than 50% of the time spent face-to-face discussing treatment plan, counseling, coordination of care.  Orders placed this encounter:  No orders of the defined types were placed in this encounter.     Derek Jack, MD [place] [phone]

## 2019-07-21 ENCOUNTER — Encounter (HOSPITAL_COMMUNITY)
Admission: RE | Admit: 2019-07-21 | Discharge: 2019-07-21 | Disposition: A | Discharge status: Home / Self Care | Payer: Medicaid Other | Source: Ambulatory Visit | Attending: General Surgery | Admitting: General Surgery

## 2019-07-21 ENCOUNTER — Encounter (HOSPITAL_COMMUNITY): Payer: Self-pay | Admitting: *Deleted

## 2019-07-21 ENCOUNTER — Other Ambulatory Visit: Payer: Self-pay

## 2019-07-21 ENCOUNTER — Telehealth (HOSPITAL_COMMUNITY): Payer: Self-pay | Admitting: Hematology

## 2019-07-21 ENCOUNTER — Other Ambulatory Visit (HOSPITAL_COMMUNITY): Payer: Self-pay | Admitting: *Deleted

## 2019-07-21 ENCOUNTER — Other Ambulatory Visit (HOSPITAL_COMMUNITY)
Admission: RE | Admit: 2019-07-21 | Discharge: 2019-07-21 | Disposition: A | Discharge status: Home / Self Care | Payer: Medicaid Other | Source: Ambulatory Visit | Attending: General Surgery | Admitting: General Surgery

## 2019-07-21 ENCOUNTER — Encounter (HOSPITAL_COMMUNITY): Payer: Self-pay

## 2019-07-21 DIAGNOSIS — Z20828 Contact with and (suspected) exposure to other viral communicable diseases: Secondary | ICD-10-CM | POA: Insufficient documentation

## 2019-07-21 DIAGNOSIS — R000 Tachycardia, unspecified: Secondary | ICD-10-CM | POA: Insufficient documentation

## 2019-07-21 DIAGNOSIS — I10 Essential (primary) hypertension: Secondary | ICD-10-CM | POA: Insufficient documentation

## 2019-07-21 DIAGNOSIS — Z01818 Encounter for other preprocedural examination: Secondary | ICD-10-CM | POA: Diagnosis not present

## 2019-07-21 HISTORY — DX: Other chronic pain: G89.29

## 2019-07-21 HISTORY — DX: Other intervertebral disc degeneration, lumbar region: M51.36

## 2019-07-21 HISTORY — DX: Polyneuropathy, unspecified: G62.9

## 2019-07-21 HISTORY — DX: Type 2 diabetes mellitus without complications: E11.9

## 2019-07-21 HISTORY — DX: Unspecified osteoarthritis, unspecified site: M19.90

## 2019-07-21 HISTORY — DX: Other intervertebral disc displacement, lumbar region: M51.26

## 2019-07-21 HISTORY — DX: Other intervertebral disc degeneration, lumbar region without mention of lumbar back pain or lower extremity pain: M51.369

## 2019-07-21 LAB — BASIC METABOLIC PANEL
Anion gap: 13 (ref 5–15)
BUN: 11 mg/dL (ref 6–20)
CO2: 24 mmol/L (ref 22–32)
Calcium: 9.6 mg/dL (ref 8.9–10.3)
Chloride: 97 mmol/L — ABNORMAL LOW (ref 98–111)
Creatinine, Ser: 0.89 mg/dL (ref 0.44–1.00)
GFR calc Af Amer: >60 mL/min (ref >60)
GFR calc non Af Amer: >60 mL/min (ref >60)
Glucose, Bld: 178 mg/dL — ABNORMAL HIGH (ref 70–99)
Potassium: 4 mmol/L (ref 3.5–5.1)
Sodium: 134 mmol/L — ABNORMAL LOW (ref 135–145)

## 2019-07-21 LAB — CBC WITH DIFFERENTIAL/PLATELET
Abs Immature Granulocytes: 0.04 10*3/uL (ref 0.00–0.07)
Basophils Absolute: 0.1 10*3/uL (ref 0.0–0.1)
Basophils Relative: 1 %
Eosinophils Absolute: 0 10*3/uL (ref 0.0–0.5)
Eosinophils Relative: 0 %
HCT: 43.4 % (ref 36.0–46.0)
Hemoglobin: 14.3 g/dL (ref 12.0–15.0)
Immature Granulocytes: 0 %
Lymphocytes Relative: 27 %
Lymphs Abs: 2.6 10*3/uL (ref 0.7–4.0)
MCH: 28.4 pg (ref 26.0–34.0)
MCHC: 32.9 g/dL (ref 30.0–36.0)
MCV: 86.1 fL (ref 80.0–100.0)
Monocytes Absolute: 0.6 10*3/uL (ref 0.1–1.0)
Monocytes Relative: 7 %
Neutro Abs: 6.3 10*3/uL (ref 1.7–7.7)
Neutrophils Relative %: 65 %
Platelets: 299 10*3/uL (ref 150–400)
RBC: 5.04 MIL/uL (ref 3.87–5.11)
RDW: 12.1 % (ref 11.5–15.5)
WBC: 9.7 10*3/uL (ref 4.0–10.5)
nRBC: 0 % (ref 0.0–0.2)

## 2019-07-21 LAB — SARS CORONAVIRUS 2 (TAT 6-24 HRS): SARS Coronavirus 2: NEGATIVE

## 2019-07-21 LAB — GLUCOSE, CAPILLARY: Glucose-Capillary: 198 mg/dL — ABNORMAL HIGH (ref 70–99)

## 2019-07-21 MED ORDER — CLONAZEPAM 1 MG PO TABS: 1.0000 mg | ORAL_TABLET | Freq: Two times a day (BID) | ORAL | 0 refills | Status: AC | PRN

## 2019-07-21 NOTE — Telephone Encounter (Signed)
PT APPROVED FOR ALIGHT GRANT AFTER BRINGING IN FIN DOC/W2  EFFT 07/21/2019 $1000.00

## 2019-07-21 NOTE — Congregational Nurse Program (Signed)
Ms. Balton referred by [place] at [facility] due to food insecurity.   Client came by today to pick up a fresh veggie/fruit and some frozen chicken along with milk , yogurt , cheese and cottage cheese.  She reports she is getting her applications turned in and should be getting her [facility] Medicaid card which she was told today would be like full Medicaid. Client states her stress levels are decreasing.   She is to have porta cath insertion tomorrow.  Will continue to follow for food insecurity. Will seek medical providers within the [place] area per her request that will accept new medicaid patients.  Will compile that list to give to Ms. Pacholski.    Debria Garret RN

## 2019-07-21 NOTE — Progress Notes (Signed)
Patient had paperwork for [employer] and the [employer] [employer].  It was all completed and patient is aware that she can pick them up when she returns for her visit next week.

## 2019-07-22 ENCOUNTER — Encounter (HOSPITAL_COMMUNITY): Payer: Self-pay

## 2019-07-22 ENCOUNTER — Ambulatory Visit (HOSPITAL_COMMUNITY): Payer: Medicaid Other | Admitting: Anesthesiology

## 2019-07-22 ENCOUNTER — Encounter (HOSPITAL_COMMUNITY)
Admission: RE | Disposition: A | Discharge status: Home / Self Care | Payer: Self-pay | Source: Home / Self Care | Attending: General Surgery

## 2019-07-22 ENCOUNTER — Other Ambulatory Visit: Payer: Self-pay

## 2019-07-22 ENCOUNTER — Ambulatory Visit (HOSPITAL_COMMUNITY): Payer: Medicaid Other

## 2019-07-22 ENCOUNTER — Ambulatory Visit (HOSPITAL_COMMUNITY)
Admission: RE | Admit: 2019-07-22 | Discharge: 2019-07-22 | Disposition: A | Discharge status: Home / Self Care | Payer: Medicaid Other | Attending: General Surgery | Admitting: General Surgery

## 2019-07-22 DIAGNOSIS — C773 Secondary and unspecified malignant neoplasm of axilla and upper limb lymph nodes: Secondary | ICD-10-CM | POA: Insufficient documentation

## 2019-07-22 DIAGNOSIS — C50912 Malignant neoplasm of unspecified site of left female breast: Secondary | ICD-10-CM | POA: Diagnosis present

## 2019-07-22 DIAGNOSIS — Z95828 Presence of other vascular implants and grafts: Secondary | ICD-10-CM | POA: Insufficient documentation

## 2019-07-22 DIAGNOSIS — I10 Essential (primary) hypertension: Secondary | ICD-10-CM | POA: Diagnosis not present

## 2019-07-22 DIAGNOSIS — F17210 Nicotine dependence, cigarettes, uncomplicated: Secondary | ICD-10-CM | POA: Diagnosis not present

## 2019-07-22 DIAGNOSIS — Z7984 Long term (current) use of oral hypoglycemic drugs: Secondary | ICD-10-CM | POA: Insufficient documentation

## 2019-07-22 DIAGNOSIS — Z79899 Other long term (current) drug therapy: Secondary | ICD-10-CM | POA: Diagnosis not present

## 2019-07-22 DIAGNOSIS — Z803 Family history of malignant neoplasm of breast: Secondary | ICD-10-CM | POA: Insufficient documentation

## 2019-07-22 HISTORY — DX: Presence of other vascular implants and grafts: Z95.828

## 2019-07-22 HISTORY — PX: PORTACATH PLACEMENT: SHX2246

## 2019-07-22 LAB — HEMOGLOBIN A1C
Hgb A1c MFr Bld: 7.6 % — ABNORMAL HIGH (ref 4.8–5.6)
Mean Plasma Glucose: 171.42 mg/dL

## 2019-07-22 LAB — GLUCOSE, CAPILLARY: Glucose-Capillary: 204 mg/dL — ABNORMAL HIGH (ref 70–99)

## 2019-07-22 SURGERY — INSERTION, TUNNELED CENTRAL VENOUS DEVICE, WITH PORT
Anesthesia: General | Site: Chest | Laterality: Right

## 2019-07-22 MED ORDER — SUCCINYLCHOLINE CHLORIDE 20 MG/ML IJ SOLN
INTRAMUSCULAR | Status: DC | PRN
  Administered 2019-07-22: 100 mg via INTRAVENOUS

## 2019-07-22 MED ORDER — ONDANSETRON HCL 4 MG/2ML IJ SOLN
INTRAMUSCULAR | Status: AC
  Filled 2019-07-22: qty 2

## 2019-07-22 MED ORDER — ONDANSETRON HCL 4 MG/2ML IJ SOLN
INTRAMUSCULAR | Status: DC | PRN
  Administered 2019-07-22: 4 mg via INTRAVENOUS

## 2019-07-22 MED ORDER — PROCHLORPERAZINE MALEATE 10 MG PO TABS: 10.0000 mg | ORAL_TABLET | Freq: Four times a day (QID) | ORAL | 1 refills | Status: AC | PRN

## 2019-07-22 MED ORDER — SODIUM CHLORIDE (PF) 0.9 % IJ SOLN
INTRAMUSCULAR | Status: DC | PRN
  Administered 2019-07-22: 10 mL via INTRAVENOUS

## 2019-07-22 MED ORDER — FENTANYL CITRATE (PF) 100 MCG/2ML IJ SOLN: INTRAMUSCULAR | Status: DC | PRN

## 2019-07-22 MED ORDER — FENTANYL CITRATE (PF) 100 MCG/2ML IJ SOLN
INTRAMUSCULAR | Status: AC
  Filled 2019-07-22: qty 2

## 2019-07-22 MED ORDER — HEPARIN SOD (PORK) LOCK FLUSH 100 UNIT/ML IV SOLN
INTRAVENOUS | Status: AC
  Filled 2019-07-22: qty 5

## 2019-07-22 MED ORDER — PHENYLEPHRINE 40 MCG/ML (10ML) SYRINGE FOR IV PUSH (FOR BLOOD PRESSURE SUPPORT)
PREFILLED_SYRINGE | INTRAVENOUS | Status: DC | PRN
  Administered 2019-07-22: 40 ug via INTRAVENOUS

## 2019-07-22 MED ORDER — LIDOCAINE HCL (PF) 1 % IJ SOLN
INTRAMUSCULAR | Status: AC
  Filled 2019-07-22: qty 30

## 2019-07-22 MED ORDER — MIDAZOLAM HCL 2 MG/2ML IJ SOLN: 0.5000 mg | Freq: Once | INTRAMUSCULAR | Status: DC | PRN

## 2019-07-22 MED ORDER — CHLORHEXIDINE GLUCONATE CLOTH 2 % EX PADS: 6.0000 | MEDICATED_PAD | Freq: Once | CUTANEOUS | Status: DC

## 2019-07-22 MED ORDER — LIDOCAINE 2% (20 MG/ML) 5 ML SYRINGE
INTRAMUSCULAR | Status: AC
  Filled 2019-07-22: qty 5

## 2019-07-22 MED ORDER — LACTATED RINGERS IV SOLN
INTRAVENOUS | Status: DC
  Administered 2019-07-22: 09:00:00 via INTRAVENOUS

## 2019-07-22 MED ORDER — MIDAZOLAM HCL 2 MG/2ML IJ SOLN
INTRAMUSCULAR | Status: DC | PRN
  Administered 2019-07-22: 2 mg via INTRAVENOUS

## 2019-07-22 MED ORDER — PROPOFOL 10 MG/ML IV BOLUS
INTRAVENOUS | Status: AC
  Filled 2019-07-22: qty 20

## 2019-07-22 MED ORDER — LIDOCAINE HCL (PF) 1 % IJ SOLN
INTRAMUSCULAR | Status: DC | PRN
  Administered 2019-07-22: 6 mL

## 2019-07-22 MED ORDER — CEFAZOLIN SODIUM-DEXTROSE 2-4 GM/100ML-% IV SOLN
2.0000 g | INTRAVENOUS | Status: AC
  Administered 2019-07-22: 09:00:00 2 g via INTRAVENOUS
  Filled 2019-07-22: qty 100

## 2019-07-22 MED ORDER — SUCCINYLCHOLINE CHLORIDE 200 MG/10ML IV SOSY
PREFILLED_SYRINGE | INTRAVENOUS | Status: AC
  Filled 2019-07-22: qty 10

## 2019-07-22 MED ORDER — PROMETHAZINE HCL 25 MG/ML IJ SOLN: 6.2500 mg | INTRAMUSCULAR | Status: DC | PRN

## 2019-07-22 MED ORDER — LIDOCAINE HCL (CARDIAC) PF 100 MG/5ML IV SOSY
PREFILLED_SYRINGE | INTRAVENOUS | Status: DC | PRN
  Administered 2019-07-22: 80 mg via INTRAVENOUS

## 2019-07-22 MED ORDER — MIDAZOLAM HCL 2 MG/2ML IJ SOLN
INTRAMUSCULAR | Status: AC
  Filled 2019-07-22: qty 2

## 2019-07-22 MED ORDER — FENTANYL CITRATE (PF) 100 MCG/2ML IJ SOLN
INTRAMUSCULAR | Status: DC | PRN
  Administered 2019-07-22: 50 ug via INTRAVENOUS

## 2019-07-22 MED ORDER — OXYCODONE HCL 5 MG PO TABS: 5.0000 mg | ORAL_TABLET | ORAL | 0 refills | Status: AC | PRN

## 2019-07-22 MED ORDER — HEPARIN SOD (PORK) LOCK FLUSH 100 UNIT/ML IV SOLN
INTRAVENOUS | Status: DC | PRN
  Administered 2019-07-22: 500 [IU] via INTRAVENOUS

## 2019-07-22 MED ORDER — PROPOFOL 10 MG/ML IV BOLUS
INTRAVENOUS | Status: DC | PRN
  Administered 2019-07-22: 150 mg via INTRAVENOUS

## 2019-07-22 MED ORDER — LIDOCAINE-PRILOCAINE 2.5-2.5 % EX CREA: TOPICAL_CREAM | CUTANEOUS | 3 refills | Status: AC

## 2019-07-22 SURGICAL SUPPLY — 36 items
APPLIER CLIP 9.375 SM OPEN (CLIP)
BAG DECANTER FOR FLEXI CONT (MISCELLANEOUS) ×3 IMPLANT
CHLORAPREP W/TINT 10.5 ML (MISCELLANEOUS) ×3 IMPLANT
CLIP APPLIE 9.375 SM OPEN (CLIP) IMPLANT
CLOTH BEACON ORANGE TIMEOUT ST (SAFETY) ×3 IMPLANT
COVER LIGHT HANDLE STERIS (MISCELLANEOUS) ×6 IMPLANT
COVER WAND RF STERILE (DRAPES) ×3 IMPLANT
DECANTER SPIKE VIAL GLASS SM (MISCELLANEOUS) ×3 IMPLANT
DERMABOND ADVANCED (GAUZE/BANDAGES/DRESSINGS) ×2
DERMABOND ADVANCED .7 DNX12 (GAUZE/BANDAGES/DRESSINGS) ×1 IMPLANT
DRAPE C-ARM FOLDED MOBILE STRL (DRAPES) ×3 IMPLANT
ELECT REM PT RETURN 9FT ADLT (ELECTROSURGICAL) ×3
ELECTRODE REM PT RTRN 9FT ADLT (ELECTROSURGICAL) ×1 IMPLANT
GLOVE BIO SURGEON STRL SZ 6.5 (GLOVE) ×2 IMPLANT
GLOVE BIO SURGEON STRL SZ7 (GLOVE) ×3 IMPLANT
GLOVE BIO SURGEONS STRL SZ 6.5 (GLOVE) ×1
GLOVE BIOGEL PI IND STRL 6.5 (GLOVE) ×1 IMPLANT
GLOVE BIOGEL PI IND STRL 7.0 (GLOVE) ×2 IMPLANT
GLOVE BIOGEL PI INDICATOR 6.5 (GLOVE) ×2
GLOVE BIOGEL PI INDICATOR 7.0 (GLOVE) ×4
GOWN STRL REUS W/TWL LRG LVL3 (GOWN DISPOSABLE) ×6 IMPLANT
IV NS 500ML (IV SOLUTION) ×2
IV NS 500ML BAXH (IV SOLUTION) ×1 IMPLANT
KIT PORT POWER 8FR ISP MRI (Port) ×3 IMPLANT
KIT TURNOVER KIT A (KITS) ×3 IMPLANT
MANIFOLD NEPTUNE II (INSTRUMENTS) ×3 IMPLANT
NEEDLE HYPO 25X1 1.5 SAFETY (NEEDLE) ×3 IMPLANT
PACK MINOR (CUSTOM PROCEDURE TRAY) ×3 IMPLANT
PAD ARMBOARD 7.5X6 YLW CONV (MISCELLANEOUS) ×3 IMPLANT
SET BASIN LINEN APH (SET/KITS/TRAYS/PACK) ×3 IMPLANT
SUT MNCRL AB 4-0 PS2 18 (SUTURE) ×3 IMPLANT
SUT PROLENE 2 0 SH 30 (SUTURE) ×3 IMPLANT
SUT VIC AB 3-0 SH 27 (SUTURE) ×2
SUT VIC AB 3-0 SH 27X BRD (SUTURE) ×1 IMPLANT
SYR 10ML LL (SYRINGE) ×6 IMPLANT
SYR CONTROL 10ML LL (SYRINGE) ×3 IMPLANT

## 2019-07-22 NOTE — Patient Instructions (Addendum)
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  You will be treated every 2 weeks for 4 cycles with two chemotherapy medications - doxorubicin (Adriamycin) and cyclophosphamide (Cytoxan).  Once those 4 cycles are complete you will begin receiving paclitaxel (Taxol) weekly for 12 weeks.  In addition to the Taxol, you will also receive two immunotherapy medications - trastuzumab (Herceptin) and pertuzumab (Perjeta).  These immunotherapies will be given along with the Taxol on the first day of that treatment, and then every 3 weeks after for 1 year.  The intent of this treatment is cure.  You will see the doctor regularly throughout treatment.  We will obtain blood work from you prior to every treatment and monitor your results to make sure it is safe to give your treatment. The doctor monitors your response to treatment by the way you are feeling, your blood work, and by obtaining scans periodically. There will be wait times while you are here for treatment.  It will take about 30 minutes to 1 hour for your lab work to result.  Then there will be wait times while pharmacy mixes your medications.   Medications you will receive in the clinic prior to your Adriamycin and Cytoxan:  Aloxi:  ALOXI is used in adults to help prevent the nausea and vomiting that happens with certain chemotherapy drugs.  Aloxi is a long acting medication, and will remain in your system for about 2 days.   Emend:  This is an anti-nausea medication that is used with Aloxi to help prevent nausea and vomiting caused by chemotherapy.  Dexamethasone:  This is a steroid given prior to chemotherapy to help prevent allergic reactions; it may also help prevent and control nausea and diarrhea.    Medications you will receive in the clinic prior to your Taxol, Herceptin, and Perjeta:  Aloxi:  ALOXI is used in adults to help prevent the nausea and vomiting that  happens with certain chemotherapies.  Aloxi is a long acting medication, and will remain in your system for 24-36 hours.   Pepcid:  This medication is a histamine blocker that helps prevent and allergic reaction to your chemotherapy.   Dexamethasone:  This is a steroid given prior to chemotherapy to help prevent allergic reactions; it may also help prevent and control nausea and diarrhea.   Benadryl:  This is a histamine blocker (different from the Pepcid) that helps prevent allergic/infusion reactions to your chemotherapy. This medication may cause dizziness/drowsiness.  Tylenol:  Given to help prevent infusion reactions (fever, chills).    Neulasta (or biosimilar) - this medication is not chemo but being given because you have had chemo. It is usually given 24-72 hours after the completion of chemotherapy. This medication works by boosting your bone marrow's supply of white blood cells. White blood cells are what protect our bodies against infection. The medication is given in the form of a subcutaneous (under the skin) injection. It is given in the fatty tissue of your abdomen or in the skin in the back of your arm. It is a short needle. The major side effect of this medication is bone or muscle pain. The drug of choice to relieve or lessen the pain is Aleve or Ibuprofen. If a physician has ever told you not to take Aleve or Ibuprofen - then don't take it. You should then take Tylenol/acetaminophen. Take either medication as the bottle directs you to.  The level of pain  you experience as a result of this injection can range from none, to mild or moderate, or severe. Please let us know if you develop moderate or severe bone pain.   You can also take Claritin 10 mg over the counter for a few days after receiving neulasta to help with the bone aches and pains.  Cyclophosphamide (Generic Name) Other Names: Cytoxan, Neosar  About This Drug Cyclophosphamide is a drug used to treat cancer. It is given  in the vein (IV) or by mouth.  Takes 30 minutes for this drug to infuse.  Possible Side Effects (More Common) . Nausea and throwing up (vomiting). These symptoms may happen within a few hours after your treatment and may last up to 72 hours. Medicines are available to stop or lessen these side effects. . Bone marrow depression. This is a decrease in the number of white blood cells, red blood cells, and platelets. This may raise your risk of infection, make you tired and weak (fatigue), and raise your risk of bleeding. . Hair loss: You may notice hair getting thin. Some patients lose their hair. Hair loss is often complete scalp hair loss and can involve loss of eyebrows, eyelashes, and pubic hair. You may notice this a few days or weeks after treatment has started. Most often hair loss is temporary; your hair should grow back when treatment is done. . Decreased appetite (decreased hunger) . Blurred vision . Soreness of the mouth and throat. You may have red areas, white patches, or sores that hurt. . Effects on the bladder. This drug may cause irritation and bleeding in the bladder. You may have blood in your urine. To help stop this, you will get extra fluids to help you pass more urine. You may get a drug called mesna, which helps to decrease irritation and bleeding. You may also get a medicine to help you pass more urine. You may have a catheter (tube) placed in your bladder so that your bladder will be washed with this drug.  Possible Side Effects (Less Common) . Darkening of the skin or nails . Metallic taste in the mouth . Changes in lung tissue may happen with large amounts of this drug. These changes may not last forever, and your lung tissue may go back to normal. Sometimes these changes may not be seen for many years. You may get a cough or have trouble catching your breath.  Allergic Reactions   Serious allergic reactions including anaphylaxis are rare. While you are getting this drug in  your vein (IV), tell your nurse right away if you have any of these symptoms of an allergic reaction: . Trouble catching your breath . Feeling like your tongue or throat are swelling . Feeling your heart beat quickly or in a not normal way (palpitations) . Feeling dizzy or lightheaded . Flushing, itching, rash, and/or hives  Treating Side Effects . Drink 6-8 cups of fluids each day unless your doctor has told you to limit your fluid intake due to some other health problem. A cup is 8 ounces of fluid. If you throw up or have loose bowel movements you should drink more fluids so that you do not become dehydrated (lack water in the body due to losing too much fluid). . Ask your doctor or nurse about medicine that is available to help stop or lessen nausea or throwing up. . Mouth care is very important. Your mouth care should consist of routine, gentle cleaning of your teeth or dentures and rinsing  your mouth with a mixture of 1/2 teaspoon of salt in 8 ounces of water or  teaspoon of baking soda in 8 ounces of water. This should be done at least after each meal and at bedtime. . If you have mouth sores, avoid mouthwash that has alcohol. Also avoid alcohol and smoking because they can bother your mouth and throat. . Talk with your nurse about getting a wig before you lose your hair. Also, call the [facility] at 800-ACS-2345 to find out information about the " Look Good.Marland KitchenMarland KitchenFeel Better" program close to where you live. It is a free program where women undergoing chemotherapy learn about wigs, turbans and scarves as well as makeup techniques and skin and nail care.  Important Information . Whenever you tell a doctor or nurse your health history, always tell them that you have received cyclophosphamide in the past. . If you take this drug by mouth swallow the medicine whole. Do not chew, break or crush it. . You can take the medicine with or without food. If you have nausea, take it with food.  Do not take the pills at bedtime.  Food and Drug Interactions There are no known interactions of cyclophosphamide with food. This drug may interact with other medicines. Tell your doctor and pharmacist about all the medicines and dietary supplements (vitamins, minerals, herbs and others) that you are taking at this time. The safety and use of dietary supplements and alternative diets are often not known. Using these might affect your cancer or interfere with your treatment. Until more is known, you should not use dietary supplements or alternative diets without your cancer doctor's help.  When to Call the Doctor  Call your doctor or nurse right away if you have any of these symptoms:  . Fever of 100.5 F (38 C) or higher . Chills . Bleeding or bruising that is not normal . Blurred vision or other changes in eyesight . Pain when passing urine; blood in urine . Pain in your lower back or side . Wheezing or trouble breathing . Swelling of legs, ankles, or feet . Feeling dizzy or lightheaded . Feeling confused or agitated . Signs of liver problems: dark urine, pale bowel movements, bad stomach pain, feeling very tired and weak, unusual itching, or yellowing of the eyes or skin . Unusual thirst or passing urine often . Nausea that stops you from eating or drinking . Throwing up more than 3 times a day  Call your doctor or nurse as soon as possible if any of these symptoms happen: . Pain in your mouth or throat that makes it hard to eat or drink . Nausea not relieved by prescribed medicines  Sexual Problems and Reproductive Concerns  . Infertility warning: Sexual problems and reproduction concerns may happen. In both men and women, this drug may affect your ability to have children. This cannot be determined before your treatment. Talk with your doctor or nurse if you plan to have children. Ask for information on sperm or egg banking. . In men, this drug may interfere with your ability to  make sperm, but it should not change your ability to have sexual relations. . In women, menstrual bleeding may become irregular or stop while you are getting this drug. Do not assume that you cannot become pregnant if you do not have a menstrual period. . Women may go through signs of menopause (change of life) like vaginal dryness or itching. Vaginal lubricants can be used to lessen vaginal dryness,  itching, and pain during sexual relations. . Genetic counseling is available for you to talk about the effects of this drug therapy on future pregnancies. Also, a genetic counselor can look at the possible risk of problems in the unborn baby due to this medicine if an exposure happens during pregnancy. . Pregnancy warning: This drug may have harmful effects on the unborn child, so effective methods of birth control should be used during your cancer treatment. . Breast feeding warning: Women should not breast feed during treatment because this drug could enter the breast milk and badly harm a breast feeding baby  Doxorubicin (Generic Name) Other Names: Adriamycin, hydroxyl daunorubicin  About This Drug Doxorubicin is a drug used to treat cancer. This drug is given in the vein (IV).  This drug is an IV push over about 10 minutes.    Possible Side Effects (More Common) . Bone marrow depression. This is a decrease in the number of white blood cells, red blood cells, and platelets. This may raise your risk of infection, make you tired and weak (fatigue), and raise your risk of bleeding. . Hair loss: Hair loss is often complete scalp hair loss and can involve loss of eyebrows, eyelashes, and pubic hair. You may notice this a few days or weeks after treatment has started. Most often hair loss is temporary; your hair should grow back when treatment is done. . Nausea and throwing up (vomiting). These symptoms may happen within a few hours after your treatment and may last up to 24 hours. Medicines are available  to stop or lessen these side effects. . Soreness of the mouth and throat. You may have red areas, white patches, or sores that hurt. . Change in the color of your urine to pink or red. This color change will go away in one to two days. . Effects on the heart: This drug can weaken the heart and lower heart function. Your heart function will be checked as needed. You may have trouble catching your breath, mainly during activities. You may also have trouble breathing while lying down, and have swelling in your ankles. . Sensitivity to light (photosensitivity). Photosensitivity means that you may become more sensitive to the effects of the sun, sun lamps, and tanning beds. Your eyes may water more, mostly in bright light. . Metallic taste in the mouth: This may change the taste of food and drinks . Decreased appetite (decreased hunger) . Darkening of the skin or nails . Weakness that interferes with your daily activities  Possible Side Effects (Less Common) . Skin and tissue irritation may involve redness, pain, warmth, or swelling at the IV site. This happens if the drug leaks out of the vein and into nearby tissue. . Changes in your liver function. Your doctor will check your liver function as needed. . This drug may cause an increased risk of developing a second cancer  Allergic Reaction Serious allergic reactions, including anaphylaxis are rare. While you are getting this drug in your vein (IV), tell your nurse right away if you have any of these symptoms of an allergic reaction: . Trouble catching your breath . Feeling like your tongue or throat are swelling . Feeling your heart beat quickly or in a not normal way (palpitations) . Feeling dizzy or lightheaded . Flushing, itching, rash, and/or hives  Treating Side Effects . Drink 6-8 cups of fluids every day unless your doctor has told you to limit your fluid intake due to some other health problem. A cup  is 8 ounces of fluid. If you vomit or  have diarrhea, you should drink more fluids so that you do not become dehydrated (lack water in the body due to losing too much fluid). . Ask your doctor or nurse about medicine that is available to help stop or lessen nausea, throwing up, and/or loose bowel movements . Wear dark sunglasses and use sunscreen with SPF 30 or higher when you are outdoors even for a short time. Cover up when you are out in the sun. Wear wide-brimmed hats, long-sleeved shirts, and pants. Keep your neck, chest, and back covered. . Mouth care is very important. Your mouth care should consist of routine, gentle cleaning of your teeth or dentures and rinsing your mouth with a mixture of 1/2 teaspoon of salt in 8 ounces of water or  teaspoon of baking soda in 8 ounces of water. This should be done at least after each meal and at bedtime. . If you have mouth sores, avoid mouthwash that has alcohol. Avoid alcohol and smoking because they can bother your mouth and throat. . Talk with your nurse about getting a wig before you lose your hair. Also, call the [place] at 800-ACS-2345 to find out information about the "Look Good, Feel Better" program close to where you live. It is a free program where women getting chemotherapy can learn about wigs, turbans and scarves as well as makeup techniques and skin and nail care. . While you are getting this drug, please tell your nurse right away if you have any pain, redness, or swelling at the site of the IV infusion.  Food and Drug Interactions There are no known interactions of doxorubicin with food. This drug may interact with other medicines. Tell your doctor and pharmacist about all the medicines and dietary supplements (vitamins, minerals, herbs and others) that you are taking at this time. The safety and use of dietary supplements and alternative diets are often not known. Using these might affect your cancer or interfere with your treatment. Until more is known, you should  not use dietary supplements or alternative diets without your cancer doctor's help.  When to Call the Doctor Call your doctor or nurse right away if you have any of these symptoms: . Fever of 100.5 F (38 C) or above . Chills . Easy bruising or bleeding . Wheezing or trouble breathing . Rash or itching . Feeling dizzy or lightheaded . Feeling that your heart is beating in a fast or not normal way (palpitations) . Loose bowel movements (diarrhea) more than 4 times a day or diarrhea with weakness or feeling lightheaded . Nausea that stops you from eating or drinking . Throwing up more than 3 times a day . Signs of liver problems: dark urine, pale bowel movements, bad stomach pain, feeling very tired and weak, unusual itching, or yellowing of the eyes or skin, . During the IV infusion, if you have pain, redness, or swelling at the site of the IV infusion, please tell your nurse right away  Call your doctor or nurse as soon as possible if any of these symptoms happen: . Decreased urine . Pain in your mouth or throat that makes it hard to eat or drink . Nausea and throwing up that is not relieved by prescribed medicines . Rash that is not relieved by prescribed medicines . Swelling of legs, ankles, or feet . Weight gain of 5 pounds in one week (fluid retention) . Lasting loss of appetite or rapid weight  loss of five pounds in a week . Fatigue that interferes with your daily activities . Extreme weakness that interferes with normal activities  Sexual Problems and Reproduction Concerns . Infertility warning: Sexual problems and reproduction concerns may happen. In both men and women, this drug may affect your ability to have children. This cannot be determined before your treatment. Talk with your doctor or nurse if you plan to have children. Ask for information on sperm or egg banking. . In men, this drug may interfere with your ability to make sperm, but it should not change your ability to  have sexual relations. . In women, menstrual bleeding may become irregular or stop while you are getting this drug. Do not assume that you cannot become pregnant if you do not have a menstrual period.   Paclitaxel (Taxol)  About This Drug Paclitaxel is a drug used to treat cancer. It is given in the vein (IV).  This will take 1 hour to infuse.  This first infusion will take longer to infuse because it is increased slowly to monitor for reactions.  The nurse will be in the room with you for the first 15 minutes of the first infusion.  Possible Side Effects . Hair loss. Hair loss is often temporary, although with certain medicine, hair loss can sometimes be permanent. Hair loss may happen suddenly or gradually. If you lose hair, you may lose it from your head, face, armpits, pubic area, chest, and/or legs. You may also notice your hair getting thin. . Swelling of your legs, ankles and/or feet (edema) . Flushing . Nausea and throwing up (vomiting) . Loose bowel movements (diarrhea) . Bone marrow depression. This is a decrease in the number of white blood cells, red blood cells, and platelets. This may raise your risk of infection, make you tired and weak (fatigue), and raise your risk of bleeding. . Effects on the nerves are called peripheral neuropathy. You may feel numbness, tingling, or pain in your hands and feet. It may be hard for you to button your clothes, open jars, or walk as usual. The effect on the nerves may get worse with more doses of the drug. These effects get better in some people after the drug is stopped but it does not get better in all people. . Changes in your liver function . Bone, joint and muscle pain . Abnormal EKG . Allergic reaction: Allergic reactions, including anaphylaxis are rare but may happen in some patients. Signs of allergic reaction to this drug may be swelling of the face, feeling like your tongue or throat are swelling, trouble breathing, rash, itching,  fever, chills, feeling dizzy, and/or feeling that your heart is beating in a fast or not normal way. If this happens, do not take another dose of this drug. You should get urgent medical treatment. . Infection . Changes in your kidney function. Note: Each of the side effects above was reported in 20% or greater of patients treated with paclitaxel. Not all possible side effects are included above.  Warnings and Precautions . Severe allergic reactions . Severe bone marrow depression  Treating Side Effects . To help with hair loss, wash with a mild shampoo and avoid washing your hair every day. . Avoid rubbing your scalp, instead, pat your hair or scalp dry . Avoid coloring your hair . Limit your use of hair spray, electric curlers, blow dryers, and curling irons. . If you are interested in getting a wig, talk to your nurse. You can also  call the [place] [facility] at 800-ACS-2345 to find out information about the "Look Good, Feel Better" program close to where you live. It is a free program where women getting chemotherapy can learn about wigs, turbans and scarves as well as makeup techniques and skin and nail care. . Ask your doctor or nurse about medicines that are available to help stop or lessen diarrhea and/or nausea. . To help with nausea and vomiting, eat small, frequent meals instead of three large meals a day. Choose foods and drinks that are at room temperature. Ask your nurse or doctor about other helpful tips and medicine that is available to help or stop lessen these symptoms. . If you get diarrhea, eat low-fiber foods that are high in protein and calories and avoid foods that can irritate your digestive tracts or lead to cramping. Ask your nurse or doctor about medicine that can lessen or stop your diarrhea. . Mouth care is very important. Your mouth care should consist of routine, gentle cleaning of your teeth or dentures and rinsing your mouth with a mixture of 1/2 teaspoon of  salt in 8 ounces of water or  teaspoon of baking soda in 8 ounces of water. This should be done at least after each meal and at bedtime. . If you have mouth sores, avoid mouthwash that has alcohol. Also avoid alcohol and smoking because they can bother your mouth and throat. . Drink plenty of fluids (a minimum of eight glasses per day is recommended). . Take your temperature as your doctor or nurse tells you, and whenever you feel like you may have a fever. . Talk to your doctor or nurse about precautions you can take to avoid infections and bleeding. . Be careful when cooking, walking, and handling sharp objects and hot liquids.  Food and Drug Interactions . There are no known interactions of paclitaxel with food. . This drug may interact with other medicines. Tell your doctor and pharmacist about all the medicines and dietary supplements (vitamins, minerals, herbs and others) that you are taking at this time. . The safety and use of dietary supplements and alternative diets are often not known. Using these might affect your cancer or interfere with your treatment. Until more is known, you should not use dietary supplements or alternative diets without your cancer doctor's help.  When to Call the Doctor Call your doctor or nurse if you have any of the following symptoms and/or any new or unusual symptoms: . Fever of 100.5 F (38 C) or above . Chills . Redness, pain, warmth, or swelling at the IV site during the infusion . Signs of allergic reaction: swelling of the face, feeling like your tongue or throat are swelling, trouble breathing, rash, itching, fever, chills, feeling dizzy, and/or feeling that your heart is beating in a fast or not normal way . Feeling that your heart is beating in a fast or not normal way (palpitations) . Weight gain of 5 pounds in one week (fluid retention) . Decreased urine or very dark urine . Signs of liver problems: dark urine, pale bowel movements, bad stomach  pain, feeling very tired and weak, unusual itching, or yellowing of the eyes or skin . Heavy menstrual period that lasts longer than normal . Easy bruising or bleeding . Nausea that stops you from eating or drinking, and/or that is not relieved by prescribed medicines. . Loose bowel movements (diarrhea) more than 4 times a day or diarrhea with weakness or lightheadedness . Pain in your  mouth or throat that makes it hard to eat or drink . Lasting loss of appetite or rapid weight loss of five pounds in a week . Signs of peripheral neuropathy: numbness, tingling, or decreased feeling in fingers or toes; trouble walking or changes in the way you walk; or feeling clumsy when buttoning clothes, opening jars, or other routine activities . Joint and muscle pain that is not relieved by prescribed medicines . Extreme fatigue that interferes with normal activities . While you are getting this drug, please tell your nurse right away if you have any pain, redness, or swelling at the site of the IV infusion. . If you think you are pregnant.  Reproduction Warnings  . Pregnancy warning: This drug may have harmful effects on the unborn child, it is recommended that effective methods of birth control should be used during your cancer treatment. Let your doctor know right away if you think you may be pregnant. . Breast feeding warning: Women should not breast feed during treatment because this drug could enter the breastmilk and cause harm to a breast feeding baby.   Trastuzumab-xxxx (Herceptin, [place], [place], [place], [place], [place])  About This Drug Trastuzumab-xxxx is used to treat cancer. It is given in the vein (IV). After the first two infusions, this medication will take 30 minutes to infuse.   Possible Side Effects . Bone marrow suppression. This is a decrease in the number of white blood cells, red blood cells, and platelets. This may raise your risk of infection, make you tired and weak  (fatigue), and raise your risk of bleeding. . Congestive heart failure - your heart has less ability to pump blood properly . Soreness of the mouth and throat. You may have red areas, white patches, or sores that hurt. . Nausea . Diarrhea (loose bowel movements) . Fever . Chills . Tiredness . Infection . Inflammation of nasal passages and throat . Changes in the way food and drinks taste . Weight loss . Headache . Trouble sleeping . Cough . Upper respiratory infection . Rash  Note: Each of the side effects above was reported in 10% or greater of patients treated with trastuzumab-xxxx. Not all possible side effects are included above.  Warnings and Precautions . Changes in the tissue of the heart and heart function. Some changes may happen that can cause your heart to have less ability to pump blood. This drug may also increase your risk of heart attack. . Serious and life-threatening lung problems such as inflammation (swelling) and scarring of the lungs which makes breathing difficult. . While you are getting this drug in your vein (IV), you may have a reaction to the drug. Sometimes you may be given medication to stop or lessen these side effects. Your nurse will check you closely for these signs: fever or shaking chills, flushing, facial swelling, feeling dizzy, headache, trouble breathing, rash, itching, chest tightness, or chest pain. These reactions may happen after your infusion. If this happens, call 911 for emergency care. . Severe decrease in the number of white blood cells, especially when receiving this drug in combination with other chemotherapy. This may raise your risk of infection which may be lifethreatening.  Note: Some of the side effects above are very rare. If you have concerns and/or questions, please discuss them with your medical team.  Important Information . This drug may be present in the saliva, tears, sweat, urine, stool, vomit, semen, and  vaginal secretions. Talk to your doctor and/or your nurse about the necessary  precautions to take during this time.  Treating Side Effects . Manage tiredness by pacing your activities for the day. . Be sure to include periods of rest between energy-draining activities. . To decrease the risk of infection, wash your hands regularly. . Avoid close contact with people who have a cold, the flu, or other infections. . Take your temperature as your doctor or nurse tells you, and whenever you feel like you may have a fever. . To help decrease the risk of bleeding, use a soft toothbrush. Check with your nurse before using dental floss. . Be very careful when using knives or tools. . Use an electric shaver instead of a razor. . Drink plenty of fluids (a minimum of eight glasses per day is recommended). . To help with nausea, eat small, frequent meals instead of three large meals a day. Choose foods and drinks that are at room temperature. Ask your nurse or doctor about other helpful tips and medicine that is available to help stop or lessen these symptoms. . Mouth care is very important. Your mouth care should consist of routine, gentle cleaning of your teeth or dentures and rinsing your mouth with a mixture of 1/2 teaspoon of salt in 8 ounces of water or 1/2 teaspoon of baking soda in 8 ounces of water. This should be done at least after each meal and at bedtime. . Taking good care of your mouth may help food taste better and improve your appetite. . If you have mouth sores, avoid mouthwash that has alcohol. Also avoid alcohol and smoking because they can bother your mouth and throat. . If you throw up or have loose bowel movements, you should drink more fluids so that you do not become dehydrated (lack of water in the body from losing too much fluid). . If you have diarrhea, eat low-fiber foods that are high in protein and calories and avoid foods that can irritate your digestive tracts or lead  to cramping. . Ask your nurse or doctor about medicine that can lessen or stop your diarrhea. . To help with weight loss, drink fluids that contribute calories (whole milk, juice, soft drinks, sweetened beverages, milkshakes, and nutritional supplements) instead of water. . Include a source of protein at every meal and snack, such as meat, poultry, fish, dry beans, tofu, eggs, nuts, milk, yogurt, cheese, ice cream, pudding, and nutritional supplements. . If you get a rash do not put anything on it unless your doctor or nurse says you may. Keep the area around the rash clean and dry. Ask your doctor for medicine if your rash bothers you. Marland Kitchen Keeping your pain under control is important to your well-being. Please tell your doctor or nurse if you are experiencing pain. . If you are having trouble sleeping, talk to your nurse or doctor on tips to help you sleep better. . Infusion reactions may occur after your infusion. If this happens, call 911 for emergency care.  Food and Drug Interactions . There are no known interactions of trastuzumab-xxxx with food. . This drug may interact with other medicines. Tell your doctor and pharmacist about all the prescription and over-the-counter medicines and dietary supplements (vitamins, minerals, herbs and others) that you are taking at this time. Also, check with your doctor or pharmacist before starting any new prescription or over-the-counter medicines, or dietary supplements to make sure that there are no interactions.  When to Call the Doctor Call your doctor or nurse if you have any of these symptoms and/or  any new or unusual symptoms: . Fever of 100.4 F (38 C) or higher . Chills . Tiredness that interferes with your daily activities . Trouble falling or staying asleep . Feeling dizzy or lightheaded . A headache that does not go away . Easy bleeding or bruising . Wheezing or trouble breathing or dry cough . Coughing up yellow, green, or bloody  mucus . Feeling that your heart is beating in a fast or not normal way (palpitations) . Chest pain or symptoms of a heart attack. Most heart attacks involve pain in the center of the chest that lasts more than a few minutes. The pain may go away and come back, or it can be constant. It can feel like pressure, squeezing, fullness, or pain. Sometimes pain is felt in one or both arms, the back, neck, jaw, or stomach. If any of these symptoms last 2 minutes, call 911. . Pain in your mouth or throat that makes it hard to eat or drink . Nausea that stops you from eating or drinking and/or is not relieved by prescribed medicines . Diarrhea, 4 times in one day or diarrhea with lack of strength or a feeling of being dizzy . Lasting loss of appetite or rapid weight loss of five pounds in a week . Swelling of arms, hand, legs, and/or feet . Weight gain of 5 pounds in one week (fluid retention) . A new rash and/or itching that is not relieved by prescribed medicines . Signs of infusion reaction: fever or shaking chills, flushing, facial swelling, feeling dizzy, headache, trouble breathing, rash, itching, chest tightness, or chest pain. If this happens call 911 for emergency care. . If you think you may be pregnant  Reproduction Warnings . Pregnancy warning: This drug can have harmful effects on the unborn baby. Women of childbearing potential should use effective methods of birth control during your cancer treatment and for 7 months after treatment. Let your doctor know right away if you think you may be pregnant during treatment or within 7 months of receiving treatment. . Breastfeeding warning: It is not known if this drug passes into breast milk. For this reason, women should talk to their doctor about the risks and benefits of breastfeeding during treatment with this drug and for 7 months after treatment because this drug may enter the breast milk and cause harm to a breastfeeding baby. .  Fertility warning: Human fertility studies have not been done with this drug. Talk with your doctor or nurse if you plan to have children. Ask for information on sperm or egg banking.  Pertuzumab (Perjeta)  About This Drug Pertuzumab is used to treat cancer. It is given in the vein (IV).  After the initial infusion, this medication will take 30 minutes to infuse.   Possible Side Effects . Bone marrow suppression. This is a decrease in the number of white blood cells, red blood cells, and platelets. This may raise your risk of infection, make you tired and weak (fatigue), and raise your risk of bleeding. . Nausea and vomiting (throwing up) . Diarrhea (loose bowel movements) . Not able to move bowels (constipation) . Tiredness . Headache . Muscle pain/aching . Effects on the nerves are called peripheral neuropathy. You may feel numbness, tingling, or pain in your hands and feet. It may be hard for you to button your clothes, open jars, or walk as usual. The effect on the nerves may get worse with more doses of the drug. These effects get better in some people  after the drug is stopped but it does not get better in all people. . Rash . Hair loss. Hair loss is often temporary, although with certain medicine, hair loss can sometimes be permanent. Hair loss may happen suddenly or gradually. If you lose hair, you may lose it from your head, face, armpits, pubic area, chest, and/or legs. You may also notice your hair getting thin. Note: Each of the side effects above was reported in 30% or greater of patients treated with pertuzumab. Not all possible side effects are included above.  Warnings and Precautions . Congestive heart failure - your heart has less ability to pump blood properly. You may be short of breath. Your arms, hands, legs and feet may swell. . Allergic reactions, including anaphylaxis are rare but may happen in some patients. Signs of allergic reaction to this drug may be  swelling of the face, feeling like your tongue or throat are swelling, trouble breathing, rash, itching, fever, chills, feeling dizzy, and/or feeling that your heart is beating in a fast or not normal way. If this happens, do not take another dose of this drug. You should get urgent medical treatment. . While you are getting this drug in your vein (IV), you may have a reaction to the drug. Sometimes you may be given medication to stop or lessen these side effects. Your nurse will check you closely for these signs: fever or shaking chills, flushing, facial swelling, feeling dizzy, headache, trouble breathing, rash, itching, chest tightness, or chest pain. These reactions may happen after your infusion. If this happens, call 911 for emergency care. Note: Some of the side effects above are very rare. If you have concerns and/or questions, please discuss them with your medical team.  Important Information . This drug may be present in the saliva, tears, sweat, urine, stool, vomit, semen, and vaginal secretions. Talk to your doctor and/or your nurse about the necessary precautions to take during this time.  Treating Side Effects . Manage tiredness by pacing your activities for the day. . Be sure to include periods of rest between energy-draining activities. . To decrease the risk of infection, wash your hands regularly. . Avoid close contact with people who have a cold, the flu, or other infections. . Take your temperature as your doctor or nurse tells you, and whenever you feel like you may have a fever. . To help decrease the risk of bleeding, use a soft toothbrush. Check with your nurse before using dental floss. . Be very careful when using knives or tools. . Use an electric shaver instead of a razor. . Drink plenty of fluids (a minimum of eight glasses per day is recommended). . To help with nausea and vomiting, eat small, frequent meals instead of three large meals a day. Choose foods  and drinks that are at room temperature. Ask your nurse or doctor about other helpful tips and medicine that is available to help stop or lessen these symptoms. . If you throw up or have loose bowel movements, you should drink more fluids so that you do not become dehydrated (lack of water in the body from losing too much fluid). . If you have diarrhea, eat low-fiber foods that are high in protein and calories and avoid foods that can irritate your digestive tracts or lead to cramping. . Ask your nurse or doctor about medicine that can lessen or stop your diarrhea or constipation. . If you are not able to move your bowels, check with your doctor or  nurse before you use enemas, laxatives, or suppositories. . If you get a rash do not put anything on it unless your doctor or nurse says you may. Keep the area around the rash clean and dry. Ask your doctor for medicine if your rash bothers you. . If you have numbness and tingling in your hands and feet, be careful when cooking, walking, and handling sharp objects and hot liquids. . Infusion reactions may occur after your infusion. If this happens, call 911 for emergency care. . To help with hair loss, wash with a mild shampoo and avoid washing your hair every day. . Avoid rubbing your scalp, pat your hair or scalp dry. . Avoid coloring your hair. . Limit your use of hair spray, electric curlers, blow dryers, and curling irons. . If you are interested in getting a wig, talk to your nurse. You can also call the [place] at 800-ACS-2345 to find out information about the "Look Good, Feel Better" program close to where you live. It is a free program where women getting chemotherapy can learn about wigs, turbans and scarves as well as makeup techniques and skin and nail care. Marland Kitchen Keeping your pain under control is important to your well-being. Please tell your doctor or nurse if you are experiencing pain. . Get regular exercise. If you feel  too tired to exercise vigorously, try taking a short walk.  Food and Drug Interactions . There are no known interactions of pertuzumab with food. . This drug may interact with other medicines. Tell your doctor and pharmacist about all the prescription and over-the-counter medicines and dietary supplements (vitamins, minerals, herbs and others) that you are taking at this time. Also, check with your doctor or pharmacist before starting any new prescription or over-the-counter medicines, or dietary supplements to make sure that there are no interactions.  When to Call the Doctor Call your doctor or nurse if you have any of these symptoms and/or any new or unusual symptoms: . Fever of 100.4 F (38 C) or higher . Chills . Trouble breathing . Tiredness or weakness that interferes with your daily activities . Feeling dizzy or lightheaded . Easy bleeding or bruising . Swelling of arms, legs, ankles, or feet . Weight gain of 5 pounds in one week (fluid retention) . Headache that does not go away . Nausea that stops you from eating or drinking and/or is not relieved by prescribed medicines . Throwing up more than 3 times a day . Diarrhea, 4 times in one day or diarrhea with lack of strength or a feeling of being dizzy . No bowel movement in 3 days or when you feel uncomfortable . A new rash or a rash that is not relieved by prescribed medicines . Numbness, tingling, or pain in your hands and feet . Signs of allergic reaction: swelling of the face, feeling like your tongue or throat are swelling, trouble breathing, rash, itching, fever, chills, feeling dizzy, and/or feeling that your heart is beating in a fast or not normal way. If this happens, call 911 for emergency care. . Signs of infusion reaction: fever or shaking chills, flushing, facial swelling, feeling dizzy, headache, trouble breathing, rash, itching, chest tightness, or chest pain. If this happens, call 911 for emergency care. .  If you think you may be pregnant  Reproduction Warnings . Pregnancy warning: This drug can have harmful effects on the unborn baby. Women of childbearing potential should use effective methods of birth control during your cancer treatment and  for 7 months after treatment. Let your doctor know right away if you think you may be pregnant. . Breastfeeding warning: It is not known if this drug passes into breast milk. For this reason, women should not breastfeed during treatment and for 7 months after treatment because this drug could enter the breast milk and cause harm to a breastfeeding baby. . Fertility warning: Human fertility studies have not been done with this drug. Talk with your doctor or nurse if you plan to have children. Ask for information on sperm or egg banking.  SELF CARE ACTIVITIES WHILE RECEIVING CHEMOTHERAPY:  Hydration Increase your fluid intake 48 hours prior to treatment and drink at least 8 to 12 cups (64 ounces) of water/decaffeinated beverages per day after treatment. You can still have your cup of coffee or soda but these beverages do not count as part of your 8 to 12 cups that you need to drink daily. No alcohol intake.  Medications Continue taking your normal prescription medication as prescribed.  If you start any new herbal or new supplements please let us know first to make sure it is safe.  Mouth Care Have teeth cleaned professionally before starting treatment. Keep dentures and partial plates clean. Use soft toothbrush and do not use mouthwashes that contain alcohol. Biotene is a good mouthwash that is available at most pharmacies or may be ordered by calling [phone]. Use warm salt water gargles (1 teaspoon salt per 1 quart warm water) before and after meals and at bedtime. If you need dental work, please let the doctor know before you go for your appointment so that we can coordinate the best possible time for you in regards to your chemo regimen. You need  to also let your dentist know that you are actively taking chemo. We may need to do labs prior to your dental appointment.  Skin Care Always use sunscreen that has not expired and with SPF (Sun Protection Factor) of 50 or higher. Wear hats to protect your head from the sun. Remember to use sunscreen on your hands, ears, face, & feet.  Use good moisturizing lotions such as udder cream, eucerin, or even Vaseline. Some chemotherapies can cause dry skin, color changes in your skin and nails.    . Avoid long, hot showers or baths. . Use gentle, fragrance-free soaps and laundry detergent. . Use moisturizers, preferably creams or ointments rather than lotions because the thicker consistency is better at preventing skin dehydration. Apply the cream or ointment within 15 minutes of showering. Reapply moisturizer at night, and moisturize your hands every time after you wash them.  Hair Loss (if your doctor says your hair will fall out)  . If your doctor says that your hair is likely to fall out, decide before you begin chemo whether you want to wear a wig. You may want to shop before treatment to match your hair color. . Hats, turbans, and scarves can also camouflage hair loss, although some people prefer to leave their heads uncovered. If you go bare-headed outdoors, be sure to use sunscreen on your scalp. . Cut your hair short. It eases the inconvenience of shedding lots of hair, but it also can reduce the emotional impact of watching your hair fall out. . Don't perm or color your hair during chemotherapy. Those chemical treatments are already damaging to hair and can enhance hair loss. Once your chemo treatments are done and your hair has grown back, it's OK to resume dyeing or perming hair.  With chemotherapy, hair loss is almost always temporary. But when it grows back, it may be a different color or texture. In older adults who still had hair color before chemotherapy, the new growth may be completely  gray.  Often, new hair is very fine and soft.  Infection Prevention Please wash your hands for at least 30 seconds using warm soapy water. Handwashing is the #1 way to prevent the spread of germs. Stay away from sick people or people who are getting over a cold. If you develop respiratory systems such as green/yellow mucus production or productive cough or persistent cough let us know and we will see if you need an antibiotic. It is a good idea to keep a pair of gloves on when going into grocery stores/[employer] to decrease your risk of coming into contact with germs on the carts, etc. Carry alcohol hand gel with you at all times and use it frequently if out in public. If your temperature reaches 100.5 or higher please call the clinic and let us know.  If it is after hours or on the weekend please go to the ER if your temperature is over 100.5.  Please have your own personal thermometer at home to use.    Sex and bodily fluids If you are going to have sex, a condom must be used to protect the person that isn't taking chemotherapy. Chemo can decrease your libido (sex drive). For a few days after chemotherapy, chemotherapy can be excreted through your bodily fluids.  When using the toilet please close the lid and flush the toilet twice.  Do this for a few day after you have had chemotherapy.   Effects of chemotherapy on your sex life Some changes are simple and won't last long. They won't affect your sex life permanently.  Sometimes you may feel: . too tired . not strong enough to be very active . sick or sore  . not in the mood . anxious or low Your anxiety might not seem related to sex. For example, you may be worried about the cancer and how your treatment is going. Or you may be worried about money, or about how you family are coping with your illness. These things can cause stress, which can affect your interest in sex. It's important to talk to your partner about how you feel. Remember - the  changes to your sex life don't usually last long. There's usually no medical reason to stop having sex during chemo. The drugs won't have any long term physical effects on your performance or enjoyment of sex. Cancer can't be passed on to your partner during sex  Contraception It's important to use reliable contraception during treatment. Avoid getting pregnant while you or your partner are having chemotherapy. This is because the drugs may harm the baby. Sometimes chemotherapy drugs can leave a man or woman infertile.  This means you would not be able to have children in the future. You might want to talk to someone about permanent infertility. It can be very difficult to learn that you may no longer be able to have children. Some people find counselling helpful. There might be ways to preserve your fertility, although this is easier for men than for women. You may want to speak to a fertility expert. You can talk about sperm banking or harvesting your eggs. You can also ask about other fertility options, such as donor eggs. If you have or have had breast cancer, your doctor might advise you not  to take the contraceptive pill. This is because the hormones in it might affect the cancer.  It is not known for sure whether or not chemotherapy drugs can be passed on through semen or secretions from the vagina. Because of this some doctors advise people to use a barrier method if you have sex during treatment. This applies to vaginal, anal or oral sex. Generally, doctors advise a barrier method only for the time you are actually having the treatment and for about a week after your treatment. Advice like this can be worrying, but this does not mean that you have to avoid being intimate with your partner. You can still have close contact with your partner and continue to enjoy sex.  Animals If you have cats or birds we just ask that you not change the litter or change the cage.  Please have someone else do this  for you while you are on chemotherapy.   Food Safety During and After Cancer Treatment Food safety is important for people both during and after cancer treatment. Cancer and cancer treatments, such as chemotherapy, radiation therapy, and stem cell/bone marrow transplantation, often weaken the immune system. This makes it harder for your body to protect itself from foodborne illness, also called food poisoning. Foodborne illness is caused by eating food that contains harmful bacteria, parasites, or viruses.  Foods to avoid Some foods have a higher risk of becoming tainted with bacteria. These include: Marland Kitchen Unwashed fresh fruit and vegetables, especially leafy vegetables that can hide dirt and other contaminants . Raw sprouts, such as alfalfa sprouts . Raw or undercooked beef, especially ground beef, or other raw or undercooked meat and poultry . Fatty, fried, or spicy foods immediately before or after treatment.  These can sit heavy on your stomach and make you feel nauseous. . Raw or undercooked shellfish, such as oysters. . Sushi and sashimi, which often contain raw fish.  . Unpasteurized beverages, such as unpasteurized fruit juices, raw milk, raw yogurt, or cider . Undercooked eggs, such as soft boiled, over easy, and poached; raw, unpasteurized eggs; or foods made with raw egg, such as homemade raw cookie dough and homemade mayonnaise  Simple steps for food safety  Shop smart. . Do not buy food stored or displayed in an unclean area. . Do not buy bruised or damaged fruits or vegetables. . Do not buy cans that have cracks, dents, or bulges. . Pick up foods that can spoil at the end of your shopping trip and store them in a cooler on the way home.  Prepare and clean up foods carefully. . Rinse all fresh fruits and vegetables under running water, and dry them with a clean towel or paper towel. . Clean the top of cans before opening them. . After preparing food, wash your hands for 20 seconds  with hot water and soap. Pay special attention to areas between fingers and under nails. . Clean your utensils and dishes with hot water and soap. Marland Kitchen Disinfect your kitchen and cutting boards using 1 teaspoon of liquid, unscented bleach mixed into 1 quart of water.    Dispose of old food. . Eat canned and packaged food before its expiration date (the "use by" or "best before" date). . Consume refrigerated leftovers within 3 to 4 days. After that time, throw out the food. Even if the food does not smell or look spoiled, it still may be unsafe. Some bacteria, such as Listeria, can grow even on foods stored in the refrigerator  if they are kept for too long.  Take precautions when eating out. . At restaurants, avoid buffets and salad bars where food sits out for a long time and comes in contact with many people. Food can become contaminated when someone with a virus, often a norovirus, or another "bug" handles it. . Put any leftover food in a "to-go" container yourself, rather than having the server do it. And, refrigerate leftovers as soon as you get home. . Choose restaurants that are clean and that are willing to prepare your food as you order it cooked.   AT HOME MEDICATIONS:                                                                                                                                                                Compazine/Prochlorperazine 10mg  tablet. Take 1 tablet every 6 hours as needed for nausea/vomiting. (This can make you sleepy)   EMLA cream. Apply a quarter size amount to port site 1 hour prior to chemo. Do not rub in. Cover with plastic wrap.    Diarrhea Sheet   If you are having loose stools/diarrhea, please purchase Imodium and begin taking as outlined:  At the first sign of poorly formed or loose stools you should begin taking Imodium (loperamide) 2 mg capsules. Take two tablets (4mg ) followed by one tablet (2mg ) every 2 hours - DO NOT EXCEED 8 tablets in 24  hours.  If it is bedtime and you are having loose stools, take 2 tablets at bedtime, then 2 tablets every 4 hours until morning.   Always call the [facility] if you are having loose stools/diarrhea that you can't get under control.  Loose stools/diarrhea leads to dehydration (loss of water) in your body.  We have other options of trying to get the loose stools/diarrhea to stop but you must let us know.   Constipation Sheet  Colace - 100 mg capsules - take 2 capsules daily.  If this doesn't help then you can increase to 2 capsules twice daily.  Please call if the above does not work for you. Do not go more than 2 days without a bowel movement.  It is very important that you do not become constipated.  It will make you feel sick to your stomach (nausea) and can cause abdominal pain and vomiting.  Nausea Sheet   Compazine/Prochlorperazine 10mg  tablet. Take 1 tablet every 6 hours as needed for nausea/vomiting (This can make you drowsy).  If you are having persistent nausea (nausea that does not stop) please call the [facility] and let us know the amount of nausea that you are experiencing.  If you begin to vomit, you need to call the [facility] and if it is the weekend and you have vomited more than  one time and can't get it to stop-go to the Emergency Room.  Persistent nausea/vomiting can lead to dehydration (loss of fluid in your body) and will make you feel very weak and unwell. Ice chips, sips of clear liquids, foods that are at room temperature, crackers, and toast tend to be better tolerated.    SYMPTOMS TO REPORT AS SOON AS POSSIBLE AFTER TREATMENT:  FEVER GREATER THAN 100.5 F  CHILLS WITH OR WITHOUT FEVER  NAUSEA AND VOMITING THAT IS NOT CONTROLLED WITH YOUR NAUSEA MEDICATION  UNUSUAL SHORTNESS OF BREATH  UNUSUAL BRUISING OR BLEEDING  TENDERNESS IN MOUTH AND THROAT WITH OR WITHOUT   PRESENCE OF ULCERS  URINARY PROBLEMS  BOWEL PROBLEMS  UNUSUAL RASH      Wear  comfortable clothing and clothing appropriate for easy access to any Portacath or PICC line. Let us know if there is anything that we can do to make your therapy better!    What to do if you need assistance after hours or on the weekends: CALL [phone].  HOLD on the line, do not hang up.  You will hear multiple messages but at the end you will be connected with a nurse triage line.  They will contact the doctor if necessary.  Most of the time they will be able to assist you.  Do not call the hospital operator.      I have been informed and understand all of the instructions given to me and have received a copy. I have been instructed to call the clinic [phone] or my family physician as soon as possible for continued medical care, if indicated. I do not have any more questions at this time but understand that I may call the [facility] or the Patient Navigator at [phone] during office hours should I have questions or need assistance in obtaining follow-up care.

## 2019-07-22 NOTE — Anesthesia Preprocedure Evaluation (Signed)
Anesthesia Evaluation  Patient identified by MRN, date of birth, ID band Patient awake    Reviewed: Allergy & Precautions, NPO status , Patient's Chart, lab work & pertinent test results  Airway Mallampati: II  TM Distance: >3 FB Neck ROM: Full    Dental no notable dental hx. (+) Teeth Intact   Pulmonary neg pulmonary ROS, Current SmokerPatient did not abstain from smoking.,    Pulmonary exam normal breath sounds clear to auscultation       Cardiovascular Exercise Tolerance: Good hypertension, Pt. on medications negative cardio ROS Normal cardiovascular examI Rhythm:Regular Rate:Normal     Neuro/Psych Anxiety negative neurological ROS  negative psych ROS   GI/Hepatic negative GI ROS, Neg liver ROS,   Endo/Other  negative endocrine ROSdiabetes  Renal/GU negative Renal ROS  negative genitourinary   Musculoskeletal  (+) Arthritis , Osteoarthritis,    Abdominal   Peds negative pediatric ROS (+)  Hematology negative hematology ROS (+)   Anesthesia Other Findings   Reproductive/Obstetrics negative OB ROS                             Anesthesia Physical Anesthesia Plan  ASA: III  Anesthesia Plan: General   Post-op Pain Management:    Induction: Intravenous  PONV Risk Score and Plan: 2 and Midazolam, Ondansetron and Treatment may vary due to age or medical condition  Airway Management Planned: Oral ETT  Additional Equipment:   Intra-op Plan:   Post-operative Plan: Extubation in OR  Informed Consent: I have reviewed the patients History and Physical, chart, labs and discussed the procedure including the risks, benefits and alternatives for the proposed anesthesia with the patient or authorized representative who has indicated his/her understanding and acceptance.     Dental advisory given  Plan Discussed with: CRNA  Anesthesia Plan Comments: (Plan Full PPE use Plan GETA D/W  PT -WTP with same after Q&A  Pt prefers GA to MAC,  had sips of h20 on ride over  Will use GETA)        Anesthesia Quick Evaluation

## 2019-07-22 NOTE — Op Note (Signed)
Operative Note 07/22/19   Preoperative Diagnosis: Left breast cancer    Postoperative Diagnosis: Same   Procedure(s) Performed: Port-A-Cath placement, right subclavian   Surgeon: Lanell Matar. Constance Haw, MD   Assistants: No qualified resident was available   Anesthesia: General Anesthesia    Anesthesiologist: Lenice Llamas, MD    Specimens: None   Estimated Blood Loss: Minimal   Fluoroscopy time: 18 seconds   Blood Replacement: None    Complications: None    Operative Findings: Normal anatomy  Indications: Tammy Rubio is a 58 yo with a locally advanced left breast cancer undergoing neoadjuvant therapy prior to surgery for her cancer. We discussed the risk of port a catheter placement including bleeding, infection, malfunction, pneumothorax, and the patient opted to proceed. She had drank some sips of water, and due to this is going to get general anesthesia for safety.   Procedure: The patient was brought into the operating room and general anesthesia was induced.  One percent lidocaine was used for local anesthesia.   The right chest and neck was prepped and draped in the usual sterile fashion.  Preoperative antibiotics were given.  An incision was made below the right clavicle. A subcutaneous pocket was formed. The needles advanced into the right subclavian vein using the Seldinger technique without difficulty. A guidewire was then advanced into the right atrium under fluoroscopic guidance.  Ectopia was not noted. An introducer and peel-away sheath were placed over the guidewire. The catheter was then inserted through the peel-away sheath and the peel-away sheath was removed.  A spot film was performed to confirm the position. The catheter was then attached to the port and the port placed in subcutaneous pocket. Adequate positioning was confirmed by fluoroscopy. Hemostasis was confirmed, and the port was secured with 2-0 prolene sutures.  Good backflow of blood was noted on aspiration  of the port. The port was flushed with heparin flush. Subcutaneous layer was reapproximated using a 3-0 Vicryl interrupted suture. The skin was closed using a 4-0 Vicryl subcuticular suture. Dermabond was applied.  All tape and needle counts were correct at the end of the procedure. The patient was transferred to PACU in stable condition. A chest x-ray will be performed at that time.  Curlene Labrum, MD [facility] [address] [place], [place] [address] [phone] (office)

## 2019-07-22 NOTE — Transfer of Care (Signed)
Immediate Anesthesia Transfer of Care Note  Patient: Tammy Rubio  Procedure(s) Performed: INSERTION PORT-A-CATH (Right Chest)  Patient Location: PACU  Anesthesia Type:General  Level of Consciousness: awake, alert  and oriented  Airway & Oxygen Therapy: Patient Spontanous Breathing  Post-op Assessment: Report given to RN, Post -op Vital signs reviewed and stable and Patient moving all extremities  Post vital signs: Reviewed and stable  Last Vitals:  Vitals Value Taken Time  BP 139/88 07/22/19 1017  Temp    Pulse 104 07/22/19 1021  Resp 15 07/22/19 1021  SpO2 92 % 07/22/19 1021  Vitals shown include unvalidated device data.  Last Pain:  Vitals:   07/22/19 0912  TempSrc: Oral  PainSc: 0-No pain      Patients Stated Pain Goal: 8 ([id number])  Complications: No apparent anesthesia complications

## 2019-07-22 NOTE — Discharge Instructions (Signed)
Keep area clean and dry. You can take a shower in 24 hours.  Do not submerge in water.  Take tylenol and ibuprofen for pain control and Roxicodone for severe pain.   Implanted Port Insertion, Care After This sheet gives you information about how to care for yourself after your procedure. Your health care provider may also give you more specific instructions. If you have problems or questions, contact your health care provider. What can I expect after the procedure? After the procedure, it is common to have:  Discomfort at the port insertion site.  Bruising on the skin over the port. This should improve over 3-4 days. Follow these instructions at home: [facility] care  After your port is placed, you will get a manufacturer's information card. The card has information about your port. Keep this card with you at all times.  Take care of the port as told by your health care provider. Ask your health care provider if you or a family member can get training for taking care of the port at home. A home health care nurse may also take care of the port.  Make sure to remember what type of port you have. Incision care      Follow instructions from your health care provider about how to take care of your port insertion site. Make sure you: ? Wash your hands with soap and water before and after you change your bandage (dressing). If soap and water are not available, use hand sanitizer. ? Change your dressing as told by your health care provider. ? Leave stitches (sutures), skin glue, or adhesive strips in place. These skin closures may need to stay in place for 2 weeks or longer. If adhesive strip edges start to loosen and curl up, you may trim the loose edges. Do not remove adhesive strips completely unless your health care provider tells you to do that.  Check your port insertion site every day for signs of infection. Check for: ? Redness, swelling, or pain. ? Fluid or blood. ? Warmth. ? Pus or a  bad smell. Activity  Return to your normal activities as told by your health care provider. Ask your health care provider what activities are safe for you.  Do not lift anything that is heavier than 10 lb (4.5 kg), or the limit that you are told, until your health care provider says that it is safe. General instructions  Take over-the-counter and prescription medicines only as told by your health care provider.  Do not take baths, swim, or use a hot tub until your health care provider approves.   You may shower.  Do not drive for 24 hours if you were given a sedative during your procedure.  Wear a medical alert bracelet in case of an emergency. This will tell any health care providers that you have a port.  Keep all follow-up visits as told by your health care provider. This is important. Contact a health care provider if:  You cannot flush your port with saline as directed, or you cannot draw blood from the port.  You have a fever or chills.  You have redness, swelling, or pain around your port insertion site.  You have fluid or blood coming from your port insertion site.  Your port insertion site feels warm to the touch.  You have pus or a bad smell coming from the port insertion site. Get help right away if:  You have chest pain or shortness of breath.  You have  bleeding from your port that you cannot control. Summary  Take care of the port as told by your health care provider. Keep the manufacturer's information card with you at all times.  Change your dressing as told by your health care provider.  Contact a health care provider if you have a fever or chills or if you have redness, swelling, or pain around your port insertion site.  Keep all follow-up visits as told by your health care provider. This information is not intended to replace advice given to you by your health care provider. Make sure you discuss any questions you have with your health care  provider. Document Released: 07/13/2013 Document Revised: 04/20/2018 Document Reviewed: 04/20/2018 Elsevier Patient Education  [place].

## 2019-07-22 NOTE — Interval H&P Note (Signed)
History and Physical Interval Note:  07/22/2019 9:04 AM  Tammy Rubio  has presented today for surgery, with the diagnosis of LEFT BREAST CANCER.  The various methods of treatment have been discussed with the patient and family. After consideration of risks, benefits and other options for treatment, the patient has consented to  Procedure(s): INSERTION PORT-A-CATH (Right) as a surgical intervention.  The patient's history has been reviewed, patient examined, no change in status, stable for surgery.  I have reviewed the patient's chart and labs.  Questions were answered to the patient's satisfaction.    No questions. Right sided due to left sided cancer and will ultimately get surgery on left. Patient sipped water at 830am, for safety she is going to get ET with general.  Virl Cagey

## 2019-07-22 NOTE — Anesthesia Procedure Notes (Signed)
Procedure Name: Intubation Date/Time: 07/22/2019 9:31 AM Performed by: Hewitt Blade, CRNA Pre-anesthesia Checklist: Patient identified, Emergency Drugs available, Suction available and Patient being monitored Patient Re-evaluated:Patient Re-evaluated prior to induction Oxygen Delivery Method: Circle system utilized Preoxygenation: Pre-oxygenation with 100% oxygen Induction Type: IV induction, Rapid sequence and Cricoid Pressure applied Laryngoscope Size: Mac and 3 Grade View: Grade I Tube type: Oral Tube size: 7.0 mm Number of attempts: 1 Airway Equipment and Method: Stylet Placement Confirmation: ETT inserted through vocal cords under direct vision,  positive ETCO2 and breath sounds checked- equal and bilateral Secured at: 21 cm Tube secured with: Tape Dental Injury: Teeth and Oropharynx as per pre-operative assessment

## 2019-07-22 NOTE — Progress Notes (Signed)
[facility]  Patient port completed. Notified Tammy Rubio, Fiance, CXR done and looks in good position without pneumothorax, will await final read.  Curlene Labrum, MD [facility] [address] [place], [place] [address] [phone] (office)

## 2019-07-22 NOTE — Anesthesia Postprocedure Evaluation (Signed)
Anesthesia Post Note  Patient: Tammy Rubio  Procedure(s) Performed: INSERTION PORT-A-CATH (Right Chest)  Patient location during evaluation: PACU Anesthesia Type: General Level of consciousness: awake and alert and oriented Pain management: pain level controlled Vital Signs Assessment: post-procedure vital signs reviewed and stable Respiratory status: spontaneous breathing, nonlabored ventilation and respiratory function stable Cardiovascular status: stable Postop Assessment: no headache, able to ambulate and no backache Anesthetic complications: no     Last Vitals:  Vitals:   07/22/19 0912  BP: 118/78  Pulse: (!) 105  Resp: 18  Temp: 36.8 C  SpO2: 94%    Last Pain:  Vitals:   07/22/19 0912  TempSrc: Oral  PainSc: 0-No pain                 Torien Ramroop Hristova

## 2019-07-25 ENCOUNTER — Inpatient Hospital Stay (HOSPITAL_COMMUNITY): Payer: Medicaid Other

## 2019-07-25 ENCOUNTER — Other Ambulatory Visit: Payer: Self-pay

## 2019-07-25 ENCOUNTER — Encounter (HOSPITAL_COMMUNITY): Payer: Self-pay | Admitting: General Surgery

## 2019-07-25 ENCOUNTER — Ambulatory Visit (HOSPITAL_COMMUNITY): Payer: Self-pay

## 2019-07-25 ENCOUNTER — Other Ambulatory Visit (HOSPITAL_COMMUNITY): Payer: Self-pay

## 2019-07-25 VITALS — BP 103/62 | HR 88 | Temp 98.1°F | Resp 18 | Wt 159.7 lb

## 2019-07-25 DIAGNOSIS — C50912 Malignant neoplasm of unspecified site of left female breast: Secondary | ICD-10-CM

## 2019-07-25 DIAGNOSIS — Z5111 Encounter for antineoplastic chemotherapy: Secondary | ICD-10-CM | POA: Diagnosis not present

## 2019-07-25 DIAGNOSIS — Z95828 Presence of other vascular implants and grafts: Secondary | ICD-10-CM

## 2019-07-25 LAB — COMPREHENSIVE METABOLIC PANEL
ALT: 15 U/L (ref 0–44)
AST: 16 U/L (ref 15–41)
Albumin: 3.4 g/dL — ABNORMAL LOW (ref 3.5–5.0)
Alkaline Phosphatase: 58 U/L (ref 38–126)
Anion gap: 9 (ref 5–15)
BUN: 11 mg/dL (ref 6–20)
CO2: 28 mmol/L (ref 22–32)
Calcium: 8.9 mg/dL (ref 8.9–10.3)
Chloride: 100 mmol/L (ref 98–111)
Creatinine, Ser: 0.9 mg/dL (ref 0.44–1.00)
GFR calc Af Amer: >60 mL/min (ref >60)
GFR calc non Af Amer: >60 mL/min (ref >60)
Glucose, Bld: 141 mg/dL — ABNORMAL HIGH (ref 70–99)
Potassium: 3.8 mmol/L (ref 3.5–5.1)
Sodium: 137 mmol/L (ref 135–145)
Total Bilirubin: 0.5 mg/dL (ref 0.3–1.2)
Total Protein: 6.3 g/dL — ABNORMAL LOW (ref 6.5–8.1)

## 2019-07-25 LAB — CBC WITH DIFFERENTIAL/PLATELET
Abs Immature Granulocytes: 0.04 10*3/uL (ref 0.00–0.07)
Basophils Absolute: 0 10*3/uL (ref 0.0–0.1)
Basophils Relative: 1 %
Eosinophils Absolute: 0.1 10*3/uL (ref 0.0–0.5)
Eosinophils Relative: 1 %
HCT: 38.8 % (ref 36.0–46.0)
Hemoglobin: 12.8 g/dL (ref 12.0–15.0)
Immature Granulocytes: 1 %
Lymphocytes Relative: 34 %
Lymphs Abs: 2.9 10*3/uL (ref 0.7–4.0)
MCH: 28.9 pg (ref 26.0–34.0)
MCHC: 33 g/dL (ref 30.0–36.0)
MCV: 87.6 fL (ref 80.0–100.0)
Monocytes Absolute: 0.8 10*3/uL (ref 0.1–1.0)
Monocytes Relative: 10 %
Neutro Abs: 4.6 10*3/uL (ref 1.7–7.7)
Neutrophils Relative %: 53 %
Platelets: 231 10*3/uL (ref 150–400)
RBC: 4.43 MIL/uL (ref 3.87–5.11)
RDW: 11.9 % (ref 11.5–15.5)
WBC: 8.5 10*3/uL (ref 4.0–10.5)
nRBC: 0 % (ref 0.0–0.2)

## 2019-07-25 MED ORDER — PALONOSETRON HCL INJECTION 0.25 MG/5ML
INTRAVENOUS | Status: AC
  Filled 2019-07-25: qty 5

## 2019-07-25 MED ORDER — SODIUM CHLORIDE 0.9 % IV SOLN
Freq: Once | INTRAVENOUS | Status: AC
  Administered 2019-07-25: 11:00:00 via INTRAVENOUS
  Filled 2019-07-25: qty 5

## 2019-07-25 MED ORDER — PALONOSETRON HCL INJECTION 0.25 MG/5ML
0.2500 mg | Freq: Once | INTRAVENOUS | Status: AC
  Administered 2019-07-25: 10:00:00 0.25 mg via INTRAVENOUS

## 2019-07-25 MED ORDER — DOXORUBICIN HCL CHEMO IV INJECTION 2 MG/ML
60.0000 mg/m2 | Freq: Once | INTRAVENOUS | Status: AC
  Administered 2019-07-25: 12:00:00 106 mg via INTRAVENOUS
  Filled 2019-07-25: qty 53

## 2019-07-25 MED ORDER — SODIUM CHLORIDE 0.9 % IV SOLN
600.0000 mg/m2 | Freq: Once | INTRAVENOUS | Status: AC
  Administered 2019-07-25: 12:00:00 1060 mg via INTRAVENOUS
  Filled 2019-07-25: qty 53

## 2019-07-25 MED ORDER — SODIUM CHLORIDE 0.9 % IV SOLN
Freq: Once | INTRAVENOUS | Status: AC
  Administered 2019-07-25: 10:00:00 via INTRAVENOUS

## 2019-07-25 MED ORDER — HEPARIN SOD (PORK) LOCK FLUSH 100 UNIT/ML IV SOLN
500.0000 [IU] | Freq: Once | INTRAVENOUS | Status: AC | PRN
  Administered 2019-07-25: 500 [IU]

## 2019-07-25 MED ORDER — SODIUM CHLORIDE 0.9% FLUSH
10.0000 mL | INTRAVENOUS | Status: DC | PRN
  Administered 2019-07-25: 13:00:00 10 mL
  Filled 2019-07-25: qty 10

## 2019-07-25 NOTE — Patient Instructions (Signed)
[facility] Discharge Instructions for Patients Receiving Chemotherapy  Today you received the following chemotherapy agents   To help prevent nausea and vomiting after your treatment, we encourage you to take your nausea medication   If you develop nausea and vomiting that is not controlled by your nausea medication, call the clinic.   BELOW ARE SYMPTOMS THAT SHOULD BE REPORTED IMMEDIATELY:  *FEVER GREATER THAN 100.5 F  *CHILLS WITH OR WITHOUT FEVER  NAUSEA AND VOMITING THAT IS NOT CONTROLLED WITH YOUR NAUSEA MEDICATION  *UNUSUAL SHORTNESS OF BREATH  *UNUSUAL BRUISING OR BLEEDING  TENDERNESS IN MOUTH AND THROAT WITH OR WITHOUT PRESENCE OF ULCERS  *URINARY PROBLEMS  *BOWEL PROBLEMS  UNUSUAL RASH Items with * indicate a potential emergency and should be followed up as soon as possible.  Feel free to call the clinic should you have any questions or concerns. The clinic phone number is [phone].  Please show the CHEMO ALERT CARD at check-in to the Emergency Department and triage nurse.   

## 2019-07-25 NOTE — Addendum Note (Signed)
Addendum  created 07/25/19 0806 by Mickel Baas, CRNA   Charge Capture section accepted, Visit diagnoses modified

## 2019-07-25 NOTE — Progress Notes (Signed)
Pt presents today for C1 Day 1 AC. MAR reviewed and updated. Pt states her Medicaid has been cleared for tx per [facility]. Per [facility] Pharmacist pt needs to sign paperwork for [employer]. AAnderson RN to teach today. Pt teaching performed pertaining to plan of care today in reference to pre-medications, chemo infusion times.  Understanding verbalized.   Treatment given today per MD orders. Tolerated infusion without adverse affects. Vital signs stable. No complaints at this time. Discharged from clinic ambulatory. F/U with [facility] as scheduled.

## 2019-07-25 NOTE — Progress Notes (Signed)

## 2019-07-25 NOTE — Patient Instructions (Signed)
[facility] Discharge Instructions  Labs drawn from portacath today   Thank you for choosing [facility] at [facility] to provide your oncology and hematology care.  To afford each patient quality time with our provider, please arrive at least 15 minutes before your scheduled appointment time.   If you have a lab appointment with the Cancer Center please come in thru the Main Entrance and check in at the main information desk.  You need to re-schedule your appointment should you arrive 10 or more minutes late.  We strive to give you quality time with our providers, and arriving late affects you and other patients whose appointments are after yours.  Also, if you no show three or more times for appointments you may be dismissed from the clinic at the providers discretion.     Again, thank you for choosing [facility].  Our hope is that these requests will decrease the amount of time that you wait before being seen by our physicians.       _____________________________________________________________  Should you have questions after your visit to [facility], please contact our office at [phone] between the hours of 8:00 a.m. and 4:30 p.m.  Voicemails left after 4:00 p.m. will not be returned until the following business day.  For prescription refill requests, have your pharmacy contact our office and allow 72 hours.    Due to Covid, you will need to wear a mask upon entering the hospital. If you do not have a mask, a mask will be given to you at the Main Entrance upon arrival. For doctor visits, patients may have 1 support person with them. For treatment visits, patients can not have anyone with them due to social distancing guidelines and our immunocompromised population.     

## 2019-07-26 ENCOUNTER — Encounter: Payer: Self-pay | Admitting: General Practice

## 2019-07-26 ENCOUNTER — Inpatient Hospital Stay (HOSPITAL_COMMUNITY): Payer: Medicaid Other | Admitting: General Practice

## 2019-07-26 NOTE — Progress Notes (Signed)
[facility] CSW Progress Notes  Call to patient to review progress towards meeting goals of obtaining insurance, food insecurity, financial need.  Had first chjemo treatment yesterday - went well, few side effects.  Was most concerned about nausea side effects.  Has been able to keep nausea in control post first treatment. Has received food box from [employer] [place], picked up food from [place].  Has gotten [employer].  Working on [employer], and [employer] financial aid applications (will need to have photo ID address match address on bills).  Feeling less stress/anxiety as a result of having more of her basic needs met.  Edwyna Shell, LCSW Clinical Social Worker Phone:  [phone] Cell:  [phone]

## 2019-07-27 ENCOUNTER — Other Ambulatory Visit: Payer: Self-pay

## 2019-07-27 ENCOUNTER — Inpatient Hospital Stay (HOSPITAL_COMMUNITY): Payer: Medicaid Other

## 2019-07-27 VITALS — BP 133/68 | HR 100 | Temp 97.5°F | Resp 18

## 2019-07-27 DIAGNOSIS — Z5111 Encounter for antineoplastic chemotherapy: Secondary | ICD-10-CM | POA: Diagnosis not present

## 2019-07-27 DIAGNOSIS — C50912 Malignant neoplasm of unspecified site of left female breast: Secondary | ICD-10-CM

## 2019-07-27 DIAGNOSIS — Z95828 Presence of other vascular implants and grafts: Secondary | ICD-10-CM

## 2019-07-27 MED ORDER — PEGFILGRASTIM-CBQV 6 MG/0.6ML ~~LOC~~ SOSY
6.0000 mg | PREFILLED_SYRINGE | Freq: Once | SUBCUTANEOUS | Status: AC
  Administered 2019-07-27: 6 mg via SUBCUTANEOUS
  Filled 2019-07-27: qty 0.6

## 2019-07-28 ENCOUNTER — Encounter (HOSPITAL_COMMUNITY): Payer: Self-pay | Admitting: *Deleted
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
## 2019-08-01 ENCOUNTER — Other Ambulatory Visit (HOSPITAL_COMMUNITY): Payer: Self-pay

## 2019-08-01 ENCOUNTER — Ambulatory Visit (HOSPITAL_COMMUNITY): Payer: Self-pay | Admitting: Hematology

## 2019-08-07 NOTE — Progress Notes (Signed)
Udenyca injection given per orders. See MAR for details.    Patient tolerated it well without problems. Vitals stable and discharged home from clinic ambulatory. Follow up as scheduled.

## 2019-08-07 NOTE — Patient Instructions (Signed)
[facility] at [facility]  Discharge Instructions:   _______________________________________________________________  Thank you for choosing [facility] at [facility] to provide your oncology and hematology care.  To afford each patient quality time with our providers, please arrive at least 15 minutes before your scheduled appointment.  You need to re-schedule your appointment if you arrive 10 or more minutes late.  We strive to give you quality time with our providers, and arriving late affects you and other patients whose appointments are after yours.  Also, if you no show three or more times for appointments you may be dismissed from the clinic.  Again, thank you for choosing [facility] at [facility]. Our hope is that these requests will allow you access to exceptional care and in a timely manner. _______________________________________________________________  If you have questions after your visit, please contact our office at [phone] between the hours of 8:30 a.m. and 5:00 p.m. Voicemails left after 4:30 p.m. will not be returned until the following business day. _______________________________________________________________  For prescription refill requests, have your pharmacy contact our office. _______________________________________________________________  Recommendations made by the consultant and any test results will be sent to your referring physician. _______________________________________________________________ 

## 2019-08-07 DEATH — deceased

## 2019-08-08 ENCOUNTER — Ambulatory Visit (HOSPITAL_COMMUNITY): Payer: Self-pay

## 2019-08-08 ENCOUNTER — Other Ambulatory Visit (HOSPITAL_COMMUNITY): Payer: Self-pay

## 2019-08-08 ENCOUNTER — Ambulatory Visit (HOSPITAL_COMMUNITY): Payer: Self-pay | Admitting: Hematology

## 2019-08-10 ENCOUNTER — Ambulatory Visit (HOSPITAL_COMMUNITY): Payer: Self-pay

## 2020-10-18 IMAGING — CT NM PET TUM IMG INITIAL (PI) SKULL BASE T - THIGH
4 series · 25 of 25 positions shown · non-contrast
Comparison: None.

CLINICAL DATA: Initial treatment strategy for left breast cancer.

EXAM:
NUCLEAR MEDICINE PET SKULL BASE TO THIGH
TECHNIQUE: 10.3 mCi F-18 FDG was injected intravenously. Full-ring PET imaging
was performed from the skull base to thigh after the radiotracer. CT
data was obtained and used for attenuation correction and anatomic
localization.
Fasting blood glucose: 125 mg/dl

[Series 3: ct wb fusion · axial · 5.0mm · 0.98mm/px · z∈[-1546,-758]mm · 8 of 316 slices shown]
[im 1/316]
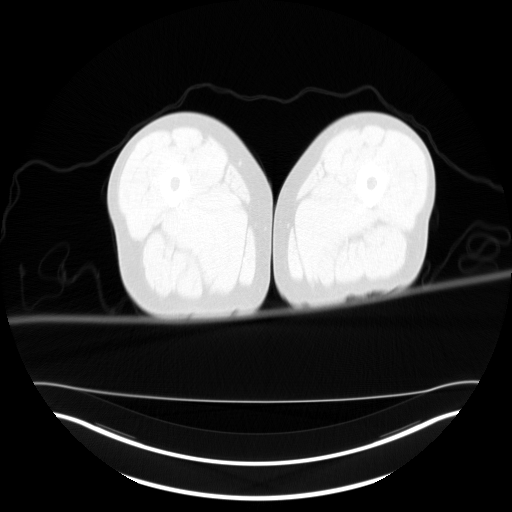
[im 46/316]
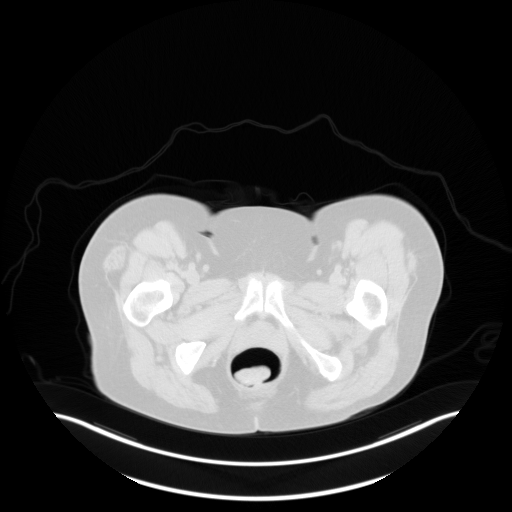
[im 91/316]
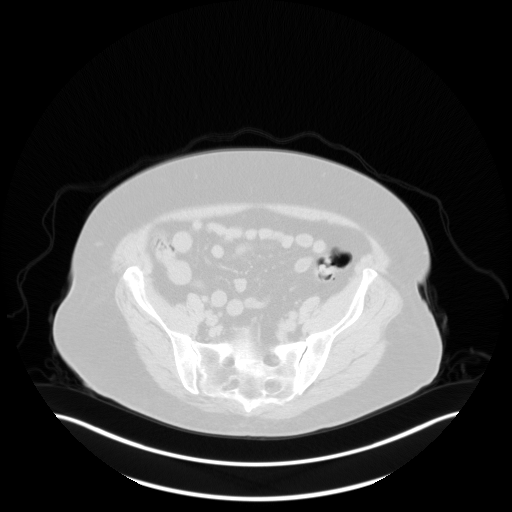
[im 136/316]
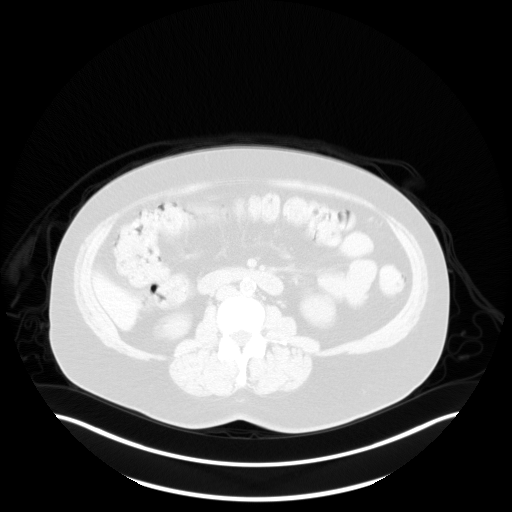
[im 181/316]
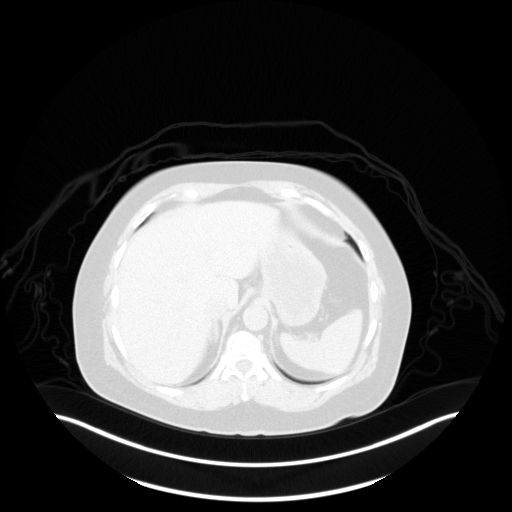
[im 226/316]
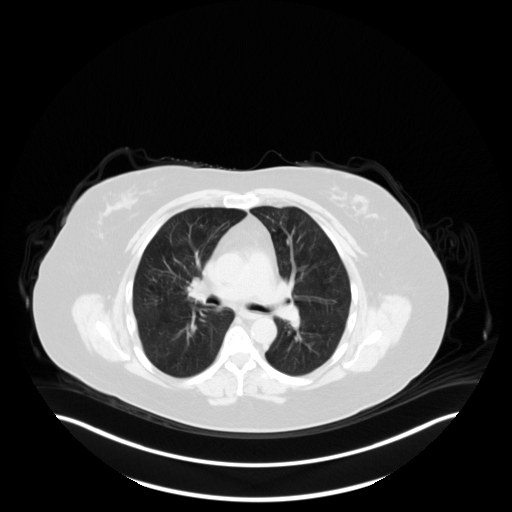
[im 271/316]
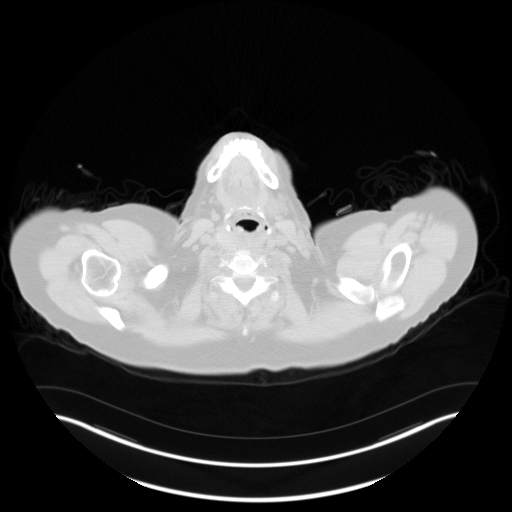
[im 316/316  brain]
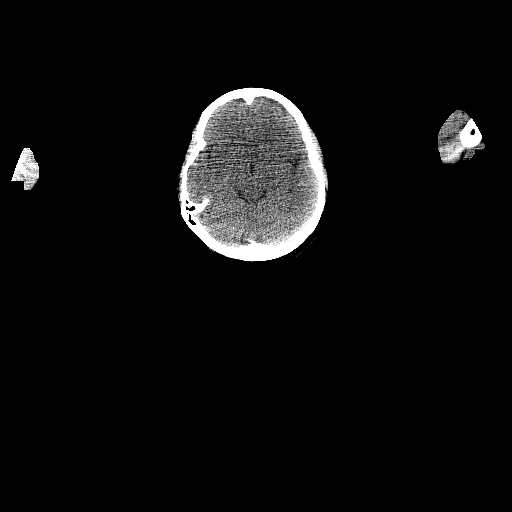

[Series 4: pet wb · axial · 5.0mm · 4.11mm/px · z∈[-1546,-758]mm · 8 of 316 slices shown]
[im 1/316]
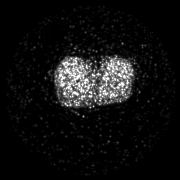
[im 46/316]
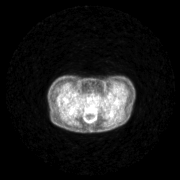
[im 91/316]
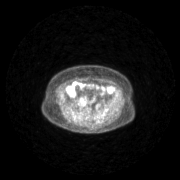
[im 136/316]
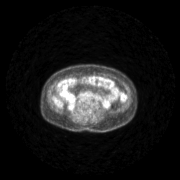
[im 181/316]
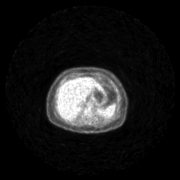
[im 226/316]
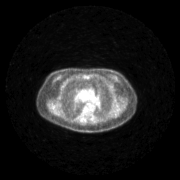
[im 271/316]
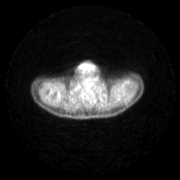
[im 316/316]
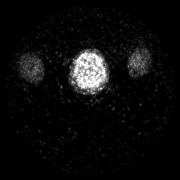

[Series 5: pet wb uncorrected · axial · 5.0mm · 4.11mm/px · z∈[-1546,-758]mm · 8 of 316 slices shown]
[im 1/316]
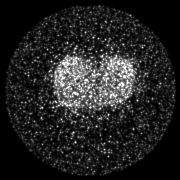
[im 46/316]
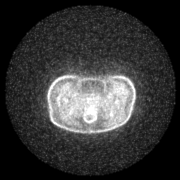
[im 91/316]
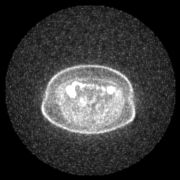
[im 136/316]
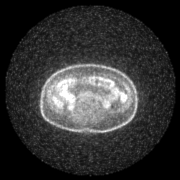
[im 181/316]
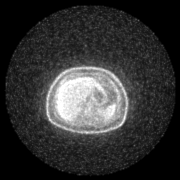
[im 226/316]
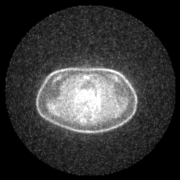
[im 271/316]
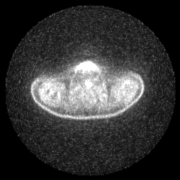
[im 316/316]
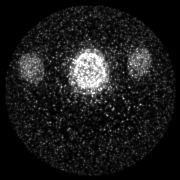

[results mm oncology reading · 1.0mm · 1.00mm/px · 1 of 5 slices shown]
[im 1/5]
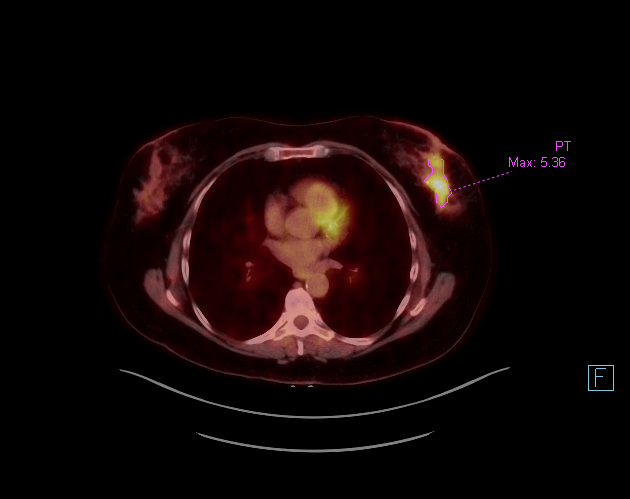

[25 of 25 positions shown; findings below may reference images not displayed]

FINDINGS: Mediastinal blood pool activity: SUV max

Liver activity: SUV max NA

NECK: No hypermetabolic cervical lymphadenopathy.

3.7 x 6.1 cm right inferior thyroid nodule (series 3/image 70), non
FDG avid, likely reflecting substernal goiter.

Incidental CT findings: none

CHEST: [DATE] x 2.5 cm left central left breast mass (series 3/image
103), max SUV 5.4, corresponding to the patient's known primary
breast neoplasm.

Left axillary nodal metastases measuring up to 11 mm short axis
(series 3/image 80), max SUV 11.5.

Small left subpectoral nodes measuring up to 6 mm short axis (series
3/image 36), max SUV 2.7.

No suspicious pulmonary nodules.

No hypermetabolic mediastinal lymphadenopathy.

Incidental CT findings: Atherosclerotic calcifications of the aortic
arch. Mild three-vessel coronary atherosclerosis.

ABDOMEN/PELVIS: No abnormal hypermetabolism in the liver, spleen,
pancreas, or adrenal glands.

No hypermetabolic lymphadenopathy in the abdomen/pelvis.

Incidental CT findings: Atherosclerotic calcifications of the aortic
arch and branch vessels. Retroaortic left renal vein.

Mild sigmoid diverticulosis, without evidence of diverticulitis.

SKELETON: No focal hypermetabolic activity to suggest skeletal
metastasis.

Incidental CT findings: none
IMPRESSION: 5.6 cm left central left breast mass, corresponding to the patient's
known primary breast neoplasm.

Left axillary and left subpectoral nodal metastases.

No evidence of distant metastases.

Large right inferior thyroid nodule, likely reflecting substernal
goiter. Consider thyroid ultrasound for further evaluation if
clinically warranted given the patient's comorbidities.

## 2020-10-19 IMAGING — MR MR BREAST BILAT WO/W CM
8 of 13 series · 25 of 48 positions shown · IV contrast (gadavist)
Comparison: PET-CT on 07/18/2019 and breast imaging 06/28/2019 and
earlier

CLINICAL DATA: Patient was recalled from screening for evaluation
of a LEFT breast mass and associated large area of calcifications.
Patient was also recalled for evaluation of possible masses in the
RIGHT breast, demonstrated to be cysts on ultrasound.
TECHNIQUE: Multiplanar, multisequence MR images of both breasts were obtained
prior to and following the intravenous administration of 7 ml of
Gadavist

[Series 2: T2 · axial · 4.0mm · 0.91mm/px · z∈[-57,+145]mm · 2 of 43 slices shown]
[im 1/43]
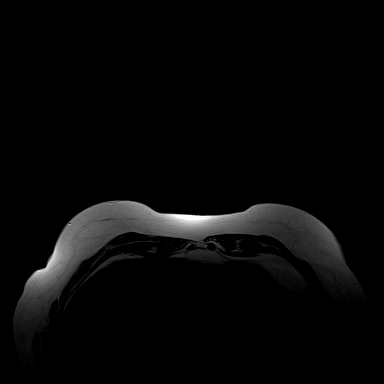
[im 43/43]
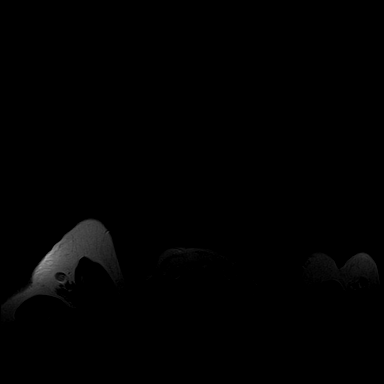

[Series 3: STIR · axial · 3.0mm · 0.70mm/px · z∈[-44,+133]mm · 2 of 60 slices shown]
[im 1/60]
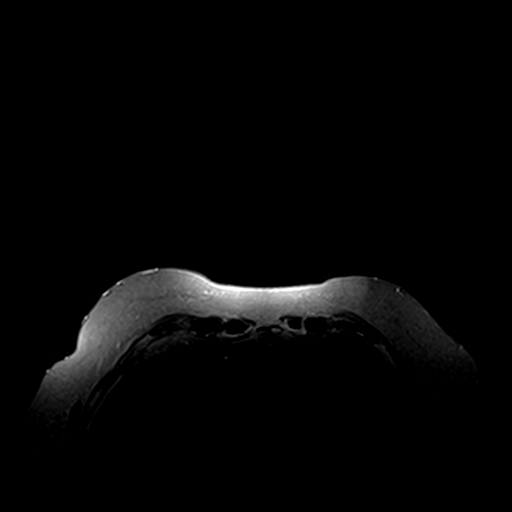
[im 60/60]
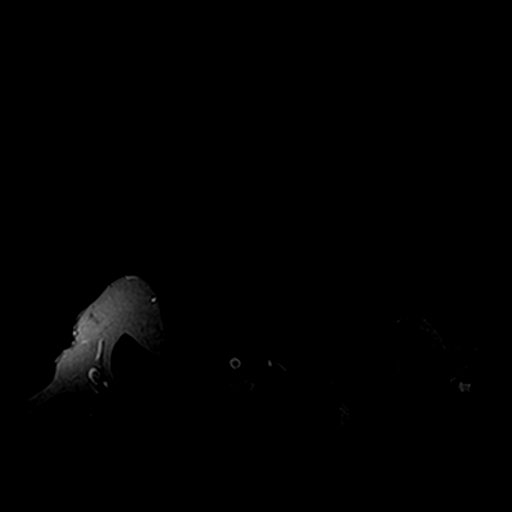

[Series 4: T1 · axial · 1.1mm · 0.70mm/px · z∈[-61,+149]mm · 6 of 192 slices shown]
[im 1/192]
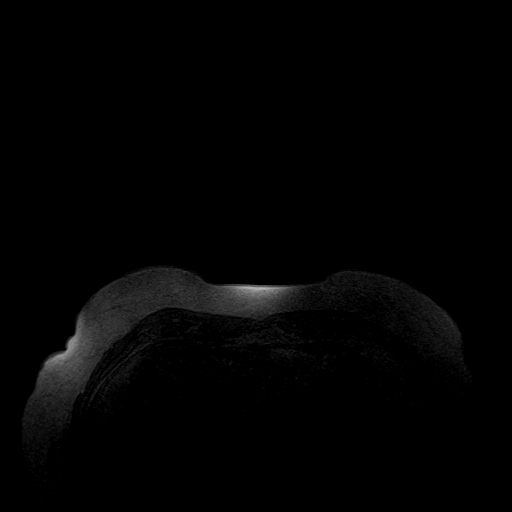
[im 39/192]
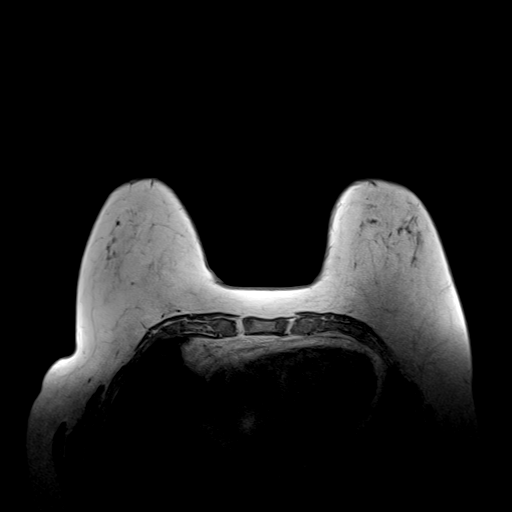
[im 77/192]
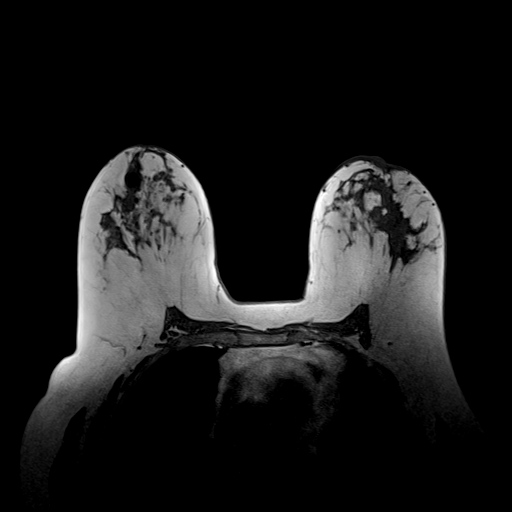
[im 115/192]
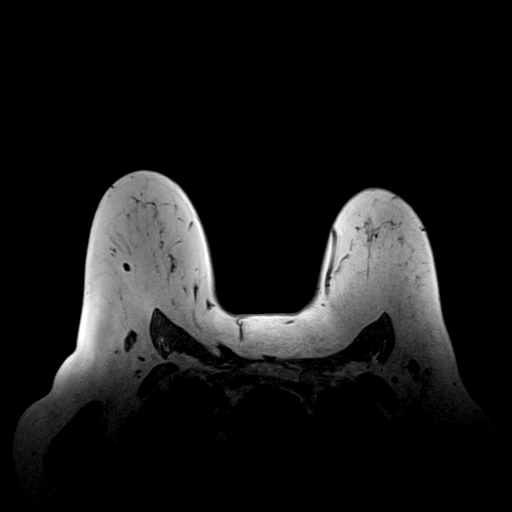
[im 153/192]
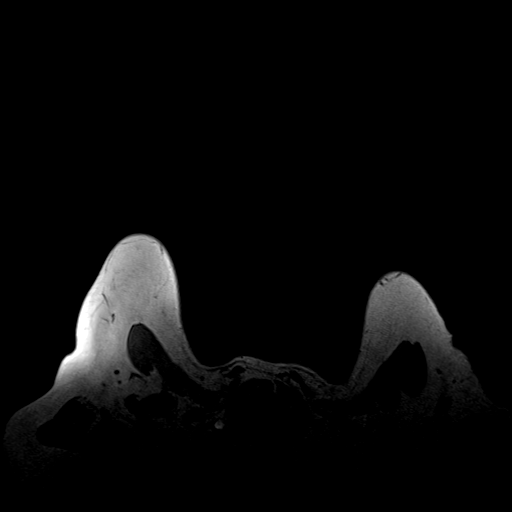
[im 192/192]
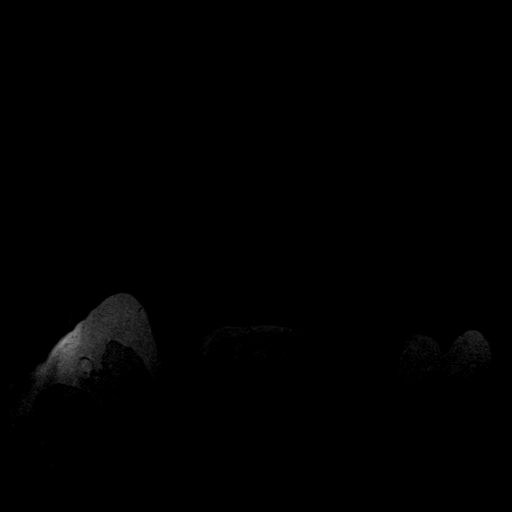

[Series 5: T1 fat-sat · axial · non-contrast · 1.3mm · 0.94mm/px · z∈[-59,+147]mm · 5 of 160 slices shown (1 of 2)]
[im 1/160]
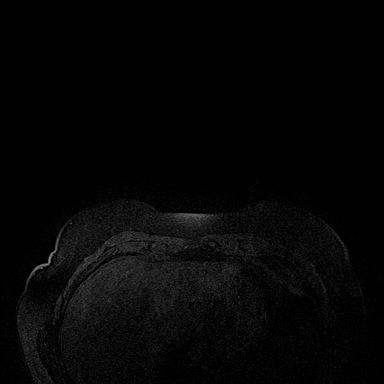
[im 40/160]
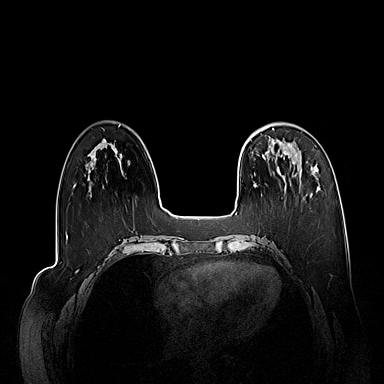
[im 80/160]
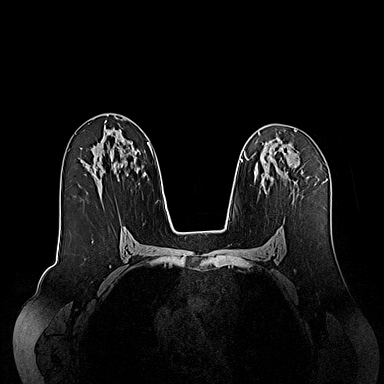
[im 120/160]
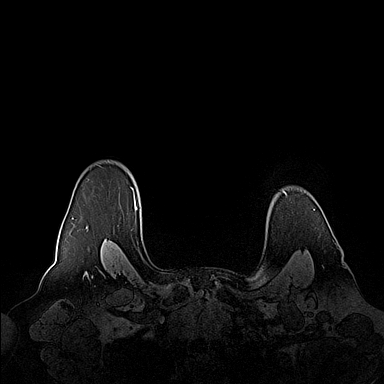
[im 160/160]
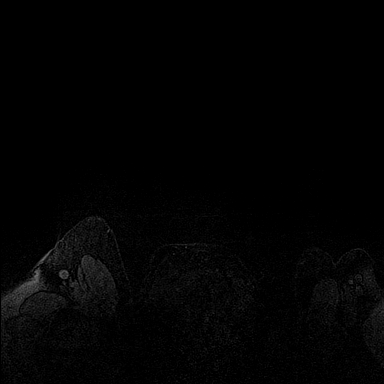

[Series 6: T1 fat-sat · axial · non-contrast · 208.0mm · 0.94mm/px · 1 of 1 slices shown (2 of 2)]
[im 1/1]
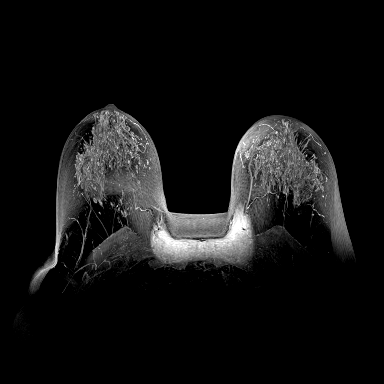

[Series 7: T1 fat-sat post-contrast · axial · 1.3mm · 0.94mm/px · z∈[-59,+147]mm · 5 of 160 slices shown (1 of 3)]
[im 1/160]
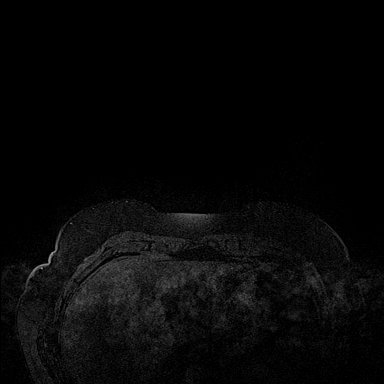
[im 40/160]
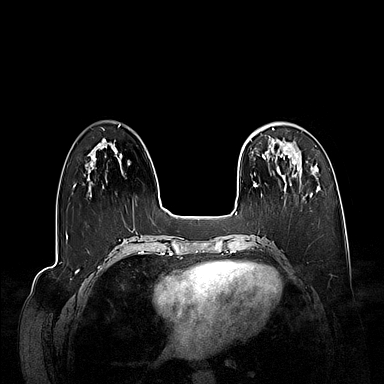
[im 80/160]
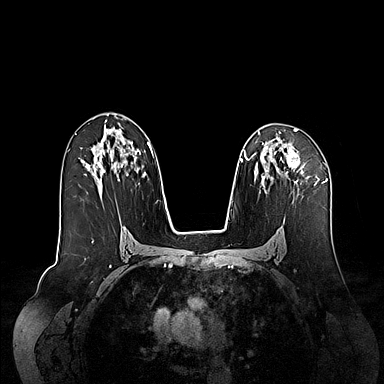
[im 120/160]
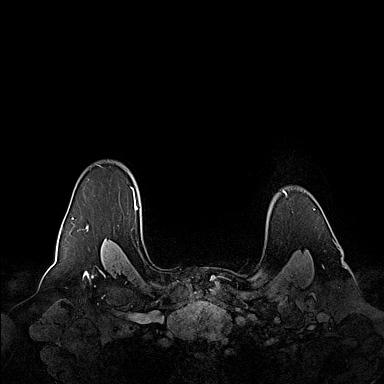
[im 160/160]
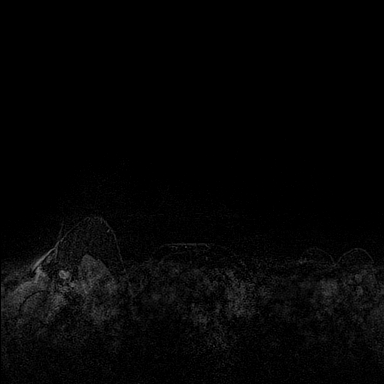

[Series 8: T1 fat-sat post-contrast · axial · 208.0mm · 0.94mm/px · 1 of 3 slices shown (2 of 3)]
[im 1/3]
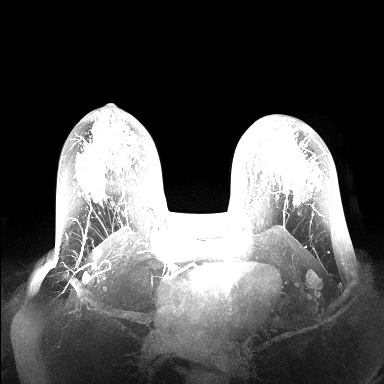

[Series 9: T1 fat-sat post-contrast · axial · 1.3mm · 0.94mm/px · z∈[-59,+43]mm · 3 of 160 slices shown (3 of 3)]
[im 1/160]
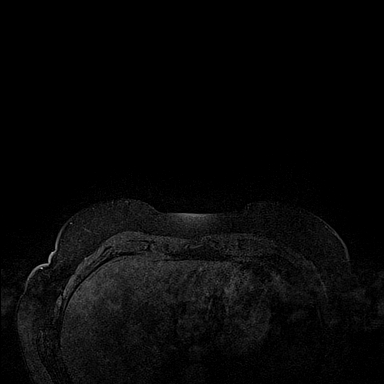
[im 40/160]
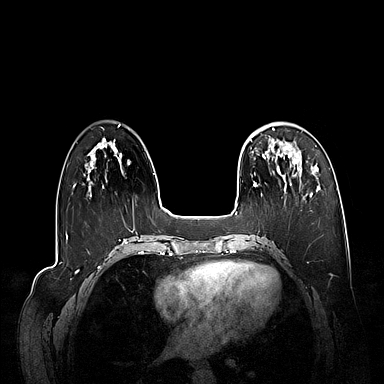
[im 80/160]
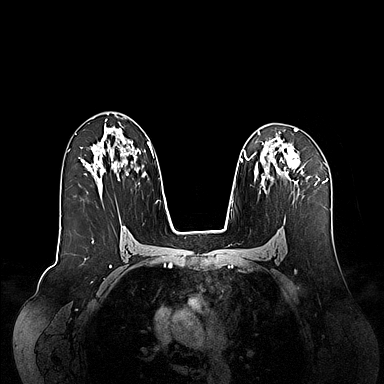

[25 of 48 positions shown; findings below may reference images not displayed]

Ultrasound-guided core biopsy of mass in the 1 o'clock location of
the LEFT breast shows invasive ductal carcinoma and ductal carcinoma
in situ.

Ultrasound-guided core biopsy of an intramammary lymph node in the 2
o'clock location of the LEFT breast is benign.

Ultrasound-guided core biopsy of enlarged LEFT axillary lymph node
shows invasive ductal carcinoma without associated nodal tissue.

LABS:  None obtained at the time of imaging.

EXAM:
BILATERAL BREAST MRI WITH AND WITHOUT CONTRAST
Three-dimensional MR images were rendered by post-processing of the
original MR data on an independent workstation. The
three-dimensional MR images were interpreted, and findings are
reported in the following complete MRI report for this study. Three
dimensional images were evaluated at the independent [employer]
workstation
FINDINGS: Breast composition: c. Heterogeneous fibroglandular tissue.

Background parenchymal enhancement: Moderate

Right breast: No mass or abnormal enhancement.

Left breast: Irregular enhancing mass in the LATERAL aspect of the
LEFT breast shows washout type enhancement kinetics and measures
(anterior-posterior) x 2.7 x 3.4 centimeters. Mass extends into the
anterior breast, and abuts the base of the LEFT nipple. There is
LEFT nipple retraction. Signal void artifact is identified within
the central aspect of the mass, consistent with tissue marker placed
at the time of biopsy. Intramammary lymph node with tissue marker
clip artifact is identified in the LATERAL portion of the LEFT
breast, consistent with benign biopsy site.

Lymph nodes: A total of 7 lymph nodes with abnormal morphology
identified within level I of the LEFT axilla. The largest of these
measures 1.9 centimeters and contains susceptibility artifact from
clip placed the time of recent biopsy. Other abnormal level I lymph
nodes measure up to 1.3 centimeters. A single level II LEFT axillary
lymph node measures 0.8 centimeters and correlates with the FDG avid
retropectoral lymph node identified on PET-CT.

No abnormal lymph nodes in the internal mammary chains.

RIGHT axilla is negative.

Ancillary findings: The is mediastinal mass is 6.0 x 4.3 x
centimeters and is contiguous with the thyroid gland. Signal
characteristics are heterogeneous and mass demonstrates enhancement
following gadolinium administration. Findings are consistent with
substernal goiter.
IMPRESSION: 5.6 centimeter mass in the LATERAL aspect of the LEFT breast,
extending to the base of the LEFT nipple an causing LEFT nipple
retraction.

A total of 8 abnormal LEFT axillary lymph nodes, 1 of which is a
level II node. The largest of the abnormal lymph nodes has been
biopsied.

No abnormality within the RIGHT breast or RIGHT axilla.

Anterior mediastinal mass most consistent with goiter.

RECOMMENDATION:
Treatment plan for known LEFT breast malignancy.

BI-RADS CATEGORY  6: Known biopsy-proven malignancy.

## 2020-10-22 IMAGING — CR DG CHEST 1V PORT
1 series · 1 of 1 positions shown · non-contrast
Comparison: 12/29/2007

CLINICAL DATA: Port-A-Cath placement.

EXAM:
PORTABLE CHEST 1 VIEW

[portable]
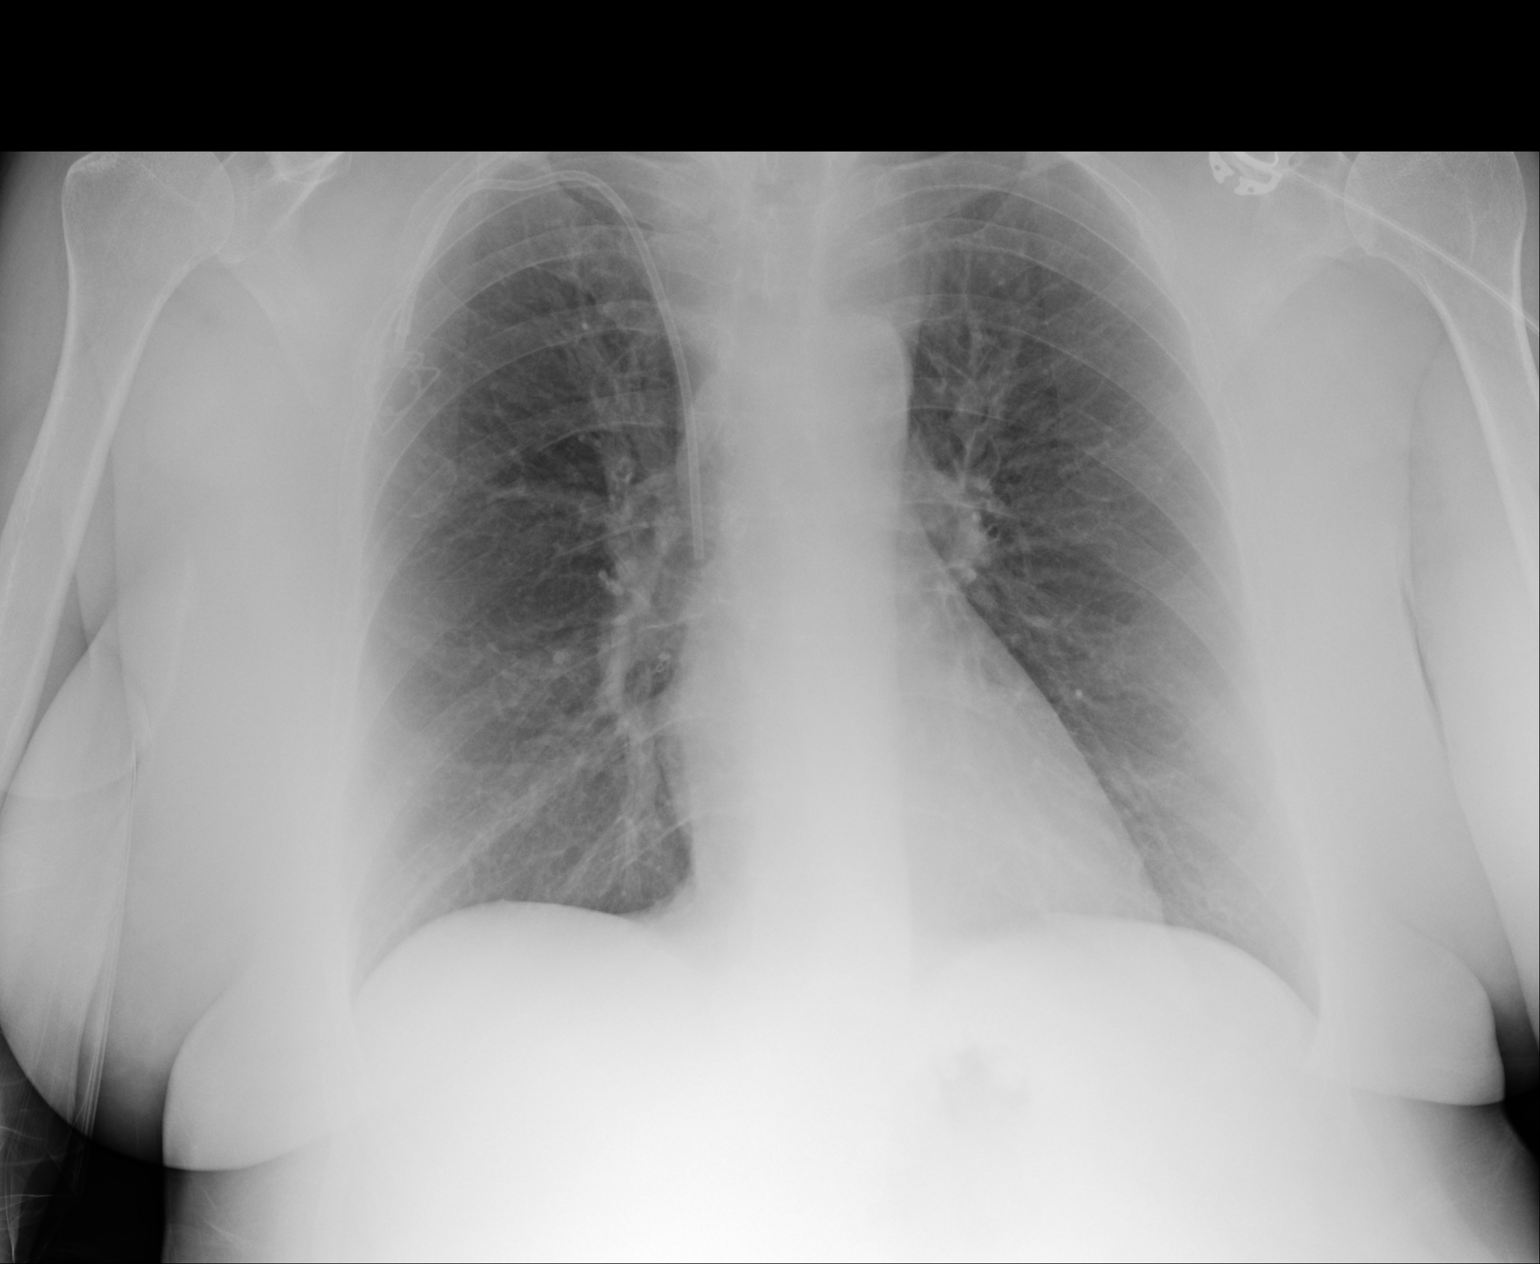

[1 of 1 positions shown; findings below may reference images not displayed]

FINDINGS: Port-A-Cath noted with tip over superior vena cava. Mediastinum
hilar structures normal. Heart size normal. Lungs are clear. No
pleural effusion or pneumothorax
IMPRESSION: 1. Port-A-Cath noted with tip over superior vena cava. No
pneumothorax.

2.  No acute cardiopulmonary disease
